# Patient Record
Sex: Female | Born: 1990 | Race: Black or African American | Hispanic: No | Marital: Single | Smoking: Never smoker
Health system: Southern US, Community
[De-identification: ages and names within clinical notes are randomized; demographics above are authoritative.]

## PROBLEM LIST (undated history)

## (undated) DIAGNOSIS — G43909 Migraine, unspecified, not intractable, without status migrainosus: Secondary | ICD-10-CM

## (undated) DIAGNOSIS — D649 Anemia, unspecified: Secondary | ICD-10-CM

## (undated) HISTORY — DX: Migraine, unspecified, not intractable, without status migrainosus: G43.909

## (undated) HISTORY — PX: OTHER SURGICAL HISTORY: SHX169

## (undated) HISTORY — PX: EYE SURGERY: SHX253

---

## 2010-01-27 ENCOUNTER — Emergency Department (HOSPITAL_COMMUNITY)
Admission: EM | Admit: 2010-01-27 | Discharge: 2010-01-27 | Payer: Self-pay | Source: Home / Self Care | Admitting: Family Medicine

## 2010-07-08 ENCOUNTER — Emergency Department (HOSPITAL_COMMUNITY)
Admission: EM | Admit: 2010-07-08 | Discharge: 2010-07-08 | Disposition: A | Discharge status: Home / Self Care | Payer: No Typology Code available for payment source | Attending: Emergency Medicine | Admitting: Emergency Medicine

## 2010-07-08 DIAGNOSIS — R109 Unspecified abdominal pain: Secondary | ICD-10-CM | POA: Insufficient documentation

## 2010-07-08 DIAGNOSIS — J45909 Unspecified asthma, uncomplicated: Secondary | ICD-10-CM | POA: Insufficient documentation

## 2010-07-08 DIAGNOSIS — R51 Headache: Secondary | ICD-10-CM | POA: Insufficient documentation

## 2010-07-08 DIAGNOSIS — R110 Nausea: Secondary | ICD-10-CM | POA: Insufficient documentation

## 2010-07-08 LAB — URINALYSIS, ROUTINE W REFLEX MICROSCOPIC
Glucose, UA: NEGATIVE mg/dL
Hgb urine dipstick: NEGATIVE
Specific Gravity, Urine: 1.014 (ref 1.005–1.030)

## 2010-07-08 LAB — PREGNANCY, URINE: Preg Test, Ur: NEGATIVE

## 2010-12-16 ENCOUNTER — Inpatient Hospital Stay (HOSPITAL_COMMUNITY)
Admission: RE | Admit: 2010-12-16 | Discharge: 2010-12-16 | Disposition: A | Discharge status: Home / Self Care | Payer: No Typology Code available for payment source | Source: Ambulatory Visit | Attending: Family Medicine | Admitting: Family Medicine

## 2010-12-21 ENCOUNTER — Inpatient Hospital Stay (HOSPITAL_COMMUNITY)
Admission: AD | Admit: 2010-12-21 | Discharge: 2010-12-21 | Disposition: A | Discharge status: Home / Self Care | Payer: No Typology Code available for payment source | Source: Ambulatory Visit | Attending: Obstetrics & Gynecology | Admitting: Obstetrics & Gynecology

## 2010-12-21 NOTE — Progress Notes (Signed)
Pt presented for std check.  Wanted full std panel drawn.  Explained to pt that we can do some std checks for her tonight but not a full check.  Pt states she would like the full work up and would rather call the health department in am.

## 2011-03-14 ENCOUNTER — Encounter (HOSPITAL_COMMUNITY): Payer: Self-pay | Admitting: *Deleted

## 2011-03-14 ENCOUNTER — Inpatient Hospital Stay (HOSPITAL_COMMUNITY)
Admission: AD | Admit: 2011-03-14 | Discharge: 2011-03-14 | Disposition: A | Discharge status: Home / Self Care | Payer: No Typology Code available for payment source | Source: Ambulatory Visit | Attending: Family Medicine | Admitting: Family Medicine

## 2011-03-14 DIAGNOSIS — B373 Candidiasis of vulva and vagina: Secondary | ICD-10-CM | POA: Insufficient documentation

## 2011-03-14 DIAGNOSIS — B3731 Acute candidiasis of vulva and vagina: Secondary | ICD-10-CM | POA: Insufficient documentation

## 2011-03-14 DIAGNOSIS — L293 Anogenital pruritus, unspecified: Secondary | ICD-10-CM | POA: Insufficient documentation

## 2011-03-14 HISTORY — DX: Anemia, unspecified: D64.9

## 2011-03-14 LAB — WET PREP, GENITAL
Trich, Wet Prep: NONE SEEN
Yeast Wet Prep HPF POC: NONE SEEN

## 2011-03-14 MED ORDER — FLUCONAZOLE 150 MG PO TABS
150.0000 mg | ORAL_TABLET | Freq: Once | ORAL | Status: AC
  Administered 2011-03-14: 150 mg via ORAL
  Filled 2011-03-14: qty 1

## 2011-03-14 NOTE — Progress Notes (Signed)
T. Burleson, NP at bedside.  Assessment done and poc discussed with pt.  

## 2011-03-14 NOTE — ED Provider Notes (Signed)
Chart reviewed and agree with management and plan.  

## 2011-03-14 NOTE — ED Provider Notes (Signed)
History     Chief Complaint  Patient presents with  . Vaginal Discharge   HPI Candace Vasquez 20 y.o. LMP 02-27-11.  Has vaginal discharge with itching and is concerned she is having either an allergic reaction to new soap, [place] spring or has trich again.  Had similar symptoms earlier this year and was diagnosed with trich.  She and her partners were treated.  Currently has female sex partner.     OB History    Grav Para Term Preterm Abortions TAB SAB Ect Mult Living   0               Past Medical History  Diagnosis Date  . Asthma   . Anemia     No past surgical history on file.  Family History  Problem Relation Age of Onset  . Cancer Mother   . Hypertension Father     History  Substance Use Topics  . Smoking status: Never Smoker   . Smokeless tobacco: Never Used  . Alcohol Use: No    Allergies: No Known Allergies  Prescriptions prior to admission  Medication Sig Dispense Refill  . IRON PO Take 1 tablet by mouth daily.          Review of Systems  Genitourinary:       Vaginal discharge with itching   Physical Exam   Blood pressure 123/71, pulse 95, temperature 98.7 F (37.1 C), temperature source Oral, resp. rate 20, height 5\' 8"  (1.727 m), weight 151 lb (68.493 kg), last menstrual period 02/27/2011.  Physical Exam  Nursing note and vitals reviewed. Constitutional: She is oriented to person, place, and time. She appears well-developed and well-nourished.  HENT:  Head: Normocephalic.  Eyes: EOM are normal.  Neck: Neck supple.  GI: Soft. There is no tenderness. There is no rebound and no guarding.  Genitourinary:       Speculum exam: Vulva:  negative Vagina - Small amount of clear mucous and  creamy discharge, no odor Cervix - No contact bleeding Bimanual exam: Cervix closed Uterus non tender, normal size Adnexa non tender, no masses bilaterally GC/Chlam, wet prep done Chaperone present for exam.  Musculoskeletal: Normal range of motion.    Neurological: She is alert and oriented to person, place, and time.  Skin: Skin is warm and dry.  Psychiatric: She has a normal mood and affect.    MAU Course  Procedures Results for orders placed during the hospital encounter of 03/14/11 (from the past 24 hour(s))  WET PREP, GENITAL     Status: Abnormal   Collection Time   03/14/11  2:50 AM      Component Value Range   Yeast, Wet Prep NONE SEEN  NONE SEEN    Trich, Wet Prep NONE SEEN  NONE SEEN    Clue Cells, Wet Prep NONE SEEN  NONE SEEN    WBC, Wet Prep HPF POC MODERATE (*) NONE SEEN        Assessment and Plan  Yeast infection  Plan Will give Diflucan in [place] GC/Chlam pending Advised to stop using [place] Spring soap and use Dove moisturizing.   BURLESON,TERRI 03/14/2011, 2:56 AM   Nolene Bernheim, NP 03/14/11 [phone]

## 2011-03-15 LAB — GC/CHLAMYDIA PROBE AMP, GENITAL: GC Probe Amp, Genital: NEGATIVE

## 2011-04-14 ENCOUNTER — Emergency Department (HOSPITAL_COMMUNITY)
Admission: EM | Admit: 2011-04-14 | Discharge: 2011-04-14 | Disposition: A | Discharge status: Home / Self Care | Payer: No Typology Code available for payment source | Attending: Emergency Medicine | Admitting: Emergency Medicine

## 2011-04-14 ENCOUNTER — Encounter (HOSPITAL_COMMUNITY): Payer: Self-pay | Admitting: Emergency Medicine

## 2011-04-14 DIAGNOSIS — R05 Cough: Secondary | ICD-10-CM | POA: Insufficient documentation

## 2011-04-14 DIAGNOSIS — J45909 Unspecified asthma, uncomplicated: Secondary | ICD-10-CM | POA: Insufficient documentation

## 2011-04-14 DIAGNOSIS — B9789 Other viral agents as the cause of diseases classified elsewhere: Secondary | ICD-10-CM | POA: Insufficient documentation

## 2011-04-14 DIAGNOSIS — H9209 Otalgia, unspecified ear: Secondary | ICD-10-CM | POA: Insufficient documentation

## 2011-04-14 DIAGNOSIS — H6090 Unspecified otitis externa, unspecified ear: Secondary | ICD-10-CM

## 2011-04-14 DIAGNOSIS — J029 Acute pharyngitis, unspecified: Secondary | ICD-10-CM | POA: Insufficient documentation

## 2011-04-14 DIAGNOSIS — R059 Cough, unspecified: Secondary | ICD-10-CM | POA: Insufficient documentation

## 2011-04-14 DIAGNOSIS — IMO0001 Reserved for inherently not codable concepts without codable children: Secondary | ICD-10-CM | POA: Insufficient documentation

## 2011-04-14 DIAGNOSIS — B349 Viral infection, unspecified: Secondary | ICD-10-CM

## 2011-04-14 DIAGNOSIS — H60399 Other infective otitis externa, unspecified ear: Secondary | ICD-10-CM | POA: Insufficient documentation

## 2011-04-14 DIAGNOSIS — J3489 Other specified disorders of nose and nasal sinuses: Secondary | ICD-10-CM | POA: Insufficient documentation

## 2011-04-14 DIAGNOSIS — R51 Headache: Secondary | ICD-10-CM | POA: Insufficient documentation

## 2011-04-14 MED ORDER — ACETAMINOPHEN-CODEINE 300-60 MG PO TABS: 1.0000 | ORAL_TABLET | ORAL | Status: AC | PRN

## 2011-04-14 MED ORDER — IBUPROFEN 800 MG PO TABS
800.0000 mg | ORAL_TABLET | Freq: Once | ORAL | Status: AC
  Administered 2011-04-14: 800 mg via ORAL
  Filled 2011-04-14: qty 1

## 2011-04-14 MED ORDER — CIPROFLOXACIN-DEXAMETHASONE 0.3-0.1 % OT SUSP
4.0000 [drp] | OTIC | Status: AC
  Administered 2011-04-14: 4 [drp] via OTIC
  Filled 2011-04-14: qty 7.5

## 2011-04-14 MED ORDER — ANTIPYRINE-BENZOCAINE 5.4-1.4 % OT SOLN
3.0000 [drp] | Freq: Once | OTIC | Status: AC
  Administered 2011-04-14: 3 [drp] via OTIC
  Filled 2011-04-14: qty 10

## 2011-04-14 MED ORDER — ONDANSETRON 4 MG PO TBDP
8.0000 mg | ORAL_TABLET | Freq: Once | ORAL | Status: AC
  Administered 2011-04-14: 8 mg via ORAL
  Filled 2011-04-14: qty 2

## 2011-04-14 MED ORDER — CIPROFLOXACIN-DEXAMETHASONE 0.3-0.1 % OT SUSP: 4.0000 [drp] | Freq: Two times a day (BID) | OTIC | Status: AC

## 2011-04-14 MED ORDER — IBUPROFEN 800 MG PO TABS: 800.0000 mg | ORAL_TABLET | Freq: Three times a day (TID) | ORAL | Status: AC

## 2011-04-14 NOTE — ED Notes (Signed)
Rx x 1, pt voiced understanding to f/u with clinic in 2 days.

## 2011-04-14 NOTE — ED Provider Notes (Signed)
History     CSN: [id number] Arrival date & time: 04/14/2011  3:17 AM   First MD Initiated Contact with Patient 04/14/11 0349      No chief complaint on file.   (Consider location/radiation/quality/duration/timing/severity/associated sxs/prior treatment) Patient is a 20 y.o. female presenting with ear pain. The history is provided by the patient.  Otalgia This is a new problem. The current episode started 12 to 24 hours ago. There is pain in the right ear. The problem occurs constantly. The problem has not changed since onset.Maximum temperature: Subjective fever and chills has not measured temperature. The fever has been present for less than 1 day. The pain is moderate. Associated symptoms include ear discharge, headaches, rhinorrhea, sore throat and cough. Pertinent negatives include no abdominal pain, no neck pain and no rash. Her past medical history does not include hearing loss.   patient presenting with flulike symptoms in addition to right ear pain. She has myalgias, sore throat, headache and congestion. Chills reports mild dry cough but no shortness of breath. Symptoms moderate in severity. No radiation of right ear pain. Quality is sharp in nature. No known sick contacts. Works in [employer] and did not get a flu shot this season.  Past Medical History  Diagnosis Date  . Asthma   . Anemia     History reviewed. No pertinent past surgical history.  Family History  Problem Relation Age of Onset  . Cancer Mother   . Hypertension Father     History  Substance Use Topics  . Smoking status: Never Smoker   . Smokeless tobacco: Never Used  . Alcohol Use: No    OB History    Grav Para Term Preterm Abortions TAB SAB Ect Mult Living   0               Review of Systems  Constitutional: Negative for fever and chills.  HENT: Positive for ear pain, sore throat, rhinorrhea and ear discharge. Negative for neck pain and neck stiffness.   Eyes: Negative for pain.  Respiratory:  Positive for cough. Negative for shortness of breath and wheezing.   Cardiovascular: Negative for chest pain, palpitations and leg swelling.  Gastrointestinal: Negative for abdominal pain.  Genitourinary: Negative for dysuria.  Musculoskeletal: Negative for back pain.  Skin: Negative for rash.  Neurological: Positive for headaches.  All other systems reviewed and are negative.    Allergies  Allegra and Zyrtec  Home Medications   Current Outpatient Rx  Name Route Sig Dispense Refill  . IRON PO Oral Take 1 tablet by mouth daily.        BP 108/63  Pulse 90  Temp 97.9 F (36.6 C)  Resp 16  SpO2 100%  LMP 04/02/2011  Physical Exam  Constitutional: She is oriented to person, place, and time. She appears well-developed and well-nourished.  HENT:  Head: Normocephalic and atraumatic.  Left Ear: External ear normal.  Mouth/Throat: No oropharyngeal exudate.       Right are regular tenderness with manipulation, and ear canal is clear other than some mild erythema. TM is clear. No mastoid tenderness or swelling.  Nasal congestion  Eyes: Conjunctivae and EOM are normal. Pupils are equal, round, and reactive to light.  Neck: Trachea normal. Neck supple. No thyromegaly present.       No nuchal rigidity  Cardiovascular: Normal rate, regular rhythm, S1 normal, S2 normal and normal pulses.     No systolic murmur is present   No diastolic murmur is present  Pulses:      Radial pulses are 2+ on the right side, and 2+ on the left side.  Pulmonary/Chest: Effort normal and breath sounds normal. She has no wheezes. She has no rhonchi. She has no rales. She exhibits no tenderness.  Abdominal: Soft. Normal appearance and bowel sounds are normal. There is no tenderness. There is no CVA tenderness and negative Murphy's sign.  Musculoskeletal: Normal range of motion.       BLE:s Calves nontender, no cords or erythema, negative Homans sign  Neurological: She is alert and oriented to person,  place, and time. She has normal strength. No cranial nerve deficit or sensory deficit. GCS eye subscore is 4. GCS verbal subscore is 5. GCS motor subscore is 6.  Skin: Skin is warm and dry. No rash noted. She is not diaphoretic.  Psychiatric: Her speech is normal.       Cooperative and appropriate    ED Course  Procedures (including critical care time)  Motrin, auralgan, Ciprodex   MDM   Clinical otitis externa also with flulike symptoms. Treated for same. Prescription for medications as above in addition to tells codeine as needed for cough. Work note provided. Influenza precautions verbalized as understood.        Sunnie Nielsen, MD 04/14/11 [phone]
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
## 2011-04-29 ENCOUNTER — Emergency Department (INDEPENDENT_AMBULATORY_CARE_PROVIDER_SITE_OTHER)
Admission: EM | Admit: 2011-04-29 | Discharge: 2011-04-29 | Disposition: A | Discharge status: Home / Self Care | Source: Home / Self Care | Attending: Emergency Medicine | Admitting: Emergency Medicine

## 2011-04-29 ENCOUNTER — Encounter (HOSPITAL_COMMUNITY): Payer: Self-pay

## 2011-04-29 DIAGNOSIS — S335XXA Sprain of ligaments of lumbar spine, initial encounter: Secondary | ICD-10-CM

## 2011-04-29 DIAGNOSIS — S39012A Strain of muscle, fascia and tendon of lower back, initial encounter: Secondary | ICD-10-CM

## 2011-04-29 LAB — POCT URINALYSIS DIP (DEVICE)
Nitrite: NEGATIVE
Urobilinogen, UA: 2 mg/dL — ABNORMAL HIGH (ref 0.0–1.0)
pH: 8.5 — ABNORMAL HIGH (ref 5.0–8.0)

## 2011-04-29 MED ORDER — METHOCARBAMOL 500 MG PO TABS: 500.0000 mg | ORAL_TABLET | Freq: Four times a day (QID) | ORAL | Status: AC

## 2011-04-29 MED ORDER — IBUPROFEN 600 MG PO TABS: 600.0000 mg | ORAL_TABLET | Freq: Four times a day (QID) | ORAL | Status: AC | PRN

## 2011-04-29 MED ORDER — HYDROCODONE-ACETAMINOPHEN 5-325 MG PO TABS: 2.0000 | ORAL_TABLET | ORAL | Status: AC | PRN

## 2011-04-29 NOTE — ED Notes (Signed)
All questions answered and discharge instructions by Dr. Chaney Malling, rx foe hydrocodone-acetaminophen 5-325, ibuprofen 600 mg, methocarbamol 500 mg given to pt

## 2011-04-29 NOTE — ED Provider Notes (Signed)
History     CSN: [id number]  Arrival date & time 04/29/11  1313   First MD Initiated Contact with Patient 04/29/11 1344      Chief Complaint  Patient presents with  . 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 Does a lot of torso rotation and heavy lifting.  Now with throbbing alternating with sharp intermittent nonradiating lower back pain worse with torso rotation, bending forward. Pain started last night. Pain lasts several minutes and resolves. Pain better with rest. Tried tylenol 1 gm w/o relief last night. No h/o injury to the back. No fevers, abd pain, urinary c/o,  vaginal c/o.   Patient is a 20 y.o. female presenting with back pain. The history is provided by the patient.  Back Pain  This is a new problem. The current episode started yesterday. The problem occurs constantly. The pain is associated with lifting heavy objects and twisting. The pain is present in the lumbar spine. The quality of the pain is described as aching and stabbing. The pain does not radiate. The symptoms are aggravated by twisting, certain positions and bending. Pertinent negatives include no chest pain, no fever, no numbness, no weight loss, no headaches, no abdominal pain, no abdominal swelling, no bowel incontinence, no bladder incontinence, no dysuria, no pelvic pain, no leg pain, no paresthesias, no paresis, no tingling and no weakness. Treatments tried: tylenol 1 gm. The treatment provided no relief.    Past Medical History  Diagnosis Date  . Asthma   . Anemia     History reviewed. No pertinent past surgical history.  Family History  Problem Relation Age of Onset  . Cancer Mother   . Hypertension Father     History  Substance Use Topics  . Smoking status: Never Smoker   . Smokeless tobacco: Never Used  . Alcohol Use: No    OB History    Grav Para Term Preterm Abortions TAB SAB Ect Mult Living   0                Review of Systems  Constitutional: Negative for fever and weight loss.  Cardiovascular: Negative for chest pain.  Gastrointestinal: Negative for abdominal pain and bowel incontinence.  Genitourinary: Negative for bladder incontinence, dysuria and pelvic pain.  Musculoskeletal: Positive for back pain.  Neurological: Negative for tingling, weakness, numbness, headaches and paresthesias.    Allergies  Allegra and Zyrtec  Home Medications   Current Outpatient Rx  Name Route Sig Dispense Refill  . HYDROCODONE-ACETAMINOPHEN 5-325 MG PO TABS Oral Take 2 tablets by mouth every 4 (four) hours as needed for pain. 20 tablet 0  . IBUPROFEN 600 MG PO TABS Oral Take 1 tablet (600 mg total) by mouth every 6 (six) hours as needed for pain. 30 tablet 0  . IRON PO Oral Take 1 tablet by mouth daily.      Marland Kitchen METHOCARBAMOL 500 MG PO TABS Oral Take 1 tablet (500 mg total) by mouth 4 (four) times daily. 40 tablet 0    BP 110/60  Pulse 75  Temp(Src) 98.4 F (36.9 C) (Oral)  Resp 15  SpO2 100%  LMP 04/20/2011  Physical Exam  Constitutional: She is oriented to person, place, and time. She appears well-developed and well-nourished. No distress.  HENT:  Head: Atraumatic.  Eyes: Conjunctivae and EOM are normal. Pupils are equal, round, and reactive to light.  Neck: Normal range  of motion.  Cardiovascular: Regular rhythm and intact distal pulses.   Pulmonary/Chest: Effort normal. No respiratory distress. She has no wheezes. She has no rales. She exhibits no tenderness.  Abdominal: Soft. There is no tenderness. There is no rebound, no guarding and no CVA tenderness.  Musculoskeletal: She exhibits no edema and no tenderness.       Lumbar back: She exhibits tenderness and spasm. She exhibits no bony tenderness, no swelling and no edema.       paralumbar Tenderness L5/S1. iBilateral lower extremities nontender, baseline ROM with intact DP  PT pulses, Sensation baseline light touch bilaterally for Pt,  DTR's symmetric and intact bilaterally KJ, Motor symmetric bilateral 5/5 hip flexion, quadriceps, hamstrings, EHL, foot dorsiflexion, foot plantarflexion, gait normal. No pain with PROM hips bilaterally. SLR neg bilaterally  Neurological: She is alert and oriented to person, place, and time.  Skin: Skin is warm. No rash noted.  Psychiatric: She has a normal mood and affect. Her behavior is normal. Judgment and thought content normal.    ED Course  Procedures (including critical care time)  Labs Reviewed  POCT URINALYSIS DIP (DEVICE) - Abnormal; Notable for the following:    Hgb urine dipstick TRACE (*)    pH 8.5 (*)    Urobilinogen, UA 2.0 (*)    All other components within normal limits  POCT PREGNANCY, URINE  POCT URINALYSIS DIPSTICK  POCT PREGNANCY, URINE   No results found.   1. Lumbar strain       MDM  No evidence of uti, nephrolithiasis. No evidence of spinal cord involvement based on H&P. Pt has changed physical activity earlier this week and reports "a lot" of lifting and torso rotation.  Pain present for <6 week duration. No red flags such as fevers, age >65, h/o trauma with bony tenderness, neurological deficits, bladder/ bowel incontinence, h/o CA, unexplained weight loss, pain worse at night,  h/o prolonged steroid use, h/o osteopenia, h/o IVDU. Imaging not indicated at this time. Will have f/u with [facility] as this happened on the job.    Luiz Blare, MD 04/29/11 [phone]

## 2011-04-29 NOTE — ED Notes (Signed)
Pt has low back pain that started last pm, no known injury and denies urinary s/s.

## 2011-06-01 ENCOUNTER — Emergency Department (INDEPENDENT_AMBULATORY_CARE_PROVIDER_SITE_OTHER)
Admission: EM | Admit: 2011-06-01 | Discharge: 2011-06-01 | Disposition: A | Discharge status: Home / Self Care | Payer: Self-pay | Source: Home / Self Care

## 2011-06-01 ENCOUNTER — Encounter (HOSPITAL_COMMUNITY): Payer: Self-pay | Admitting: Emergency Medicine

## 2011-06-01 DIAGNOSIS — M62838 Other muscle spasm: Secondary | ICD-10-CM

## 2011-06-01 DIAGNOSIS — R51 Headache: Secondary | ICD-10-CM

## 2011-06-01 MED ORDER — IBUPROFEN 600 MG PO TABS: 600.0000 mg | ORAL_TABLET | Freq: Three times a day (TID) | ORAL | Status: DC | PRN

## 2011-06-01 MED ORDER — CYCLOBENZAPRINE HCL 10 MG PO TABS: 10.0000 mg | ORAL_TABLET | Freq: Two times a day (BID) | ORAL | Status: DC | PRN
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
## 2011-06-02 ENCOUNTER — Encounter (HOSPITAL_COMMUNITY): Payer: Self-pay | Admitting: Emergency Medicine

## 2011-06-02 ENCOUNTER — Emergency Department (HOSPITAL_COMMUNITY)
Admission: EM | Admit: 2011-06-02 | Discharge: 2011-06-03 | Disposition: A | Discharge status: Home / Self Care | Payer: No Typology Code available for payment source | Attending: Emergency Medicine | Admitting: Emergency Medicine

## 2011-06-02 ENCOUNTER — Emergency Department (HOSPITAL_COMMUNITY): Payer: No Typology Code available for payment source

## 2011-06-02 DIAGNOSIS — J45909 Unspecified asthma, uncomplicated: Secondary | ICD-10-CM | POA: Insufficient documentation

## 2011-06-02 DIAGNOSIS — R51 Headache: Secondary | ICD-10-CM | POA: Insufficient documentation

## 2011-06-02 DIAGNOSIS — R42 Dizziness and giddiness: Secondary | ICD-10-CM | POA: Insufficient documentation

## 2011-06-02 DIAGNOSIS — M545 Low back pain, unspecified: Secondary | ICD-10-CM | POA: Insufficient documentation

## 2011-06-02 DIAGNOSIS — S060X9A Concussion with loss of consciousness of unspecified duration, initial encounter: Secondary | ICD-10-CM | POA: Insufficient documentation

## 2011-06-02 DIAGNOSIS — H5500 Unspecified nystagmus: Secondary | ICD-10-CM | POA: Insufficient documentation

## 2011-06-02 DIAGNOSIS — H538 Other visual disturbances: Secondary | ICD-10-CM | POA: Insufficient documentation

## 2011-06-02 DIAGNOSIS — S060XAA Concussion with loss of consciousness status unknown, initial encounter: Secondary | ICD-10-CM | POA: Insufficient documentation

## 2011-06-02 MED ORDER — OXYCODONE-ACETAMINOPHEN 5-325 MG PO TABS
1.0000 | ORAL_TABLET | Freq: Once | ORAL | Status: AC
  Administered 2011-06-02: 1 via ORAL
  Filled 2011-06-02: qty 1
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
## 2011-06-03 MED ORDER — HYDROCODONE-ACETAMINOPHEN 5-325 MG PO TABS: ORAL_TABLET | ORAL | Status: AC

## 2011-06-03 NOTE — ED Provider Notes (Signed)
Medical screening examination/treatment/procedure(s) were performed by non-physician practitioner and as supervising physician I was immediately available for consultation/collaboration.  Flint Melter, MD 06/03/11 [phone]

## 2011-12-15 ENCOUNTER — Encounter (HOSPITAL_COMMUNITY): Payer: Self-pay

## 2011-12-15 ENCOUNTER — Emergency Department (INDEPENDENT_AMBULATORY_CARE_PROVIDER_SITE_OTHER)
Admission: EM | Admit: 2011-12-15 | Discharge: 2011-12-15 | Disposition: A | Discharge status: Home / Self Care | Payer: No Typology Code available for payment source | Source: Home / Self Care

## 2011-12-15 DIAGNOSIS — S058X9A Other injuries of unspecified eye and orbit, initial encounter: Secondary | ICD-10-CM

## 2011-12-15 DIAGNOSIS — H5711 Ocular pain, right eye: Secondary | ICD-10-CM

## 2011-12-15 DIAGNOSIS — H5710 Ocular pain, unspecified eye: Secondary | ICD-10-CM

## 2011-12-15 DIAGNOSIS — S0501XA Injury of conjunctiva and corneal abrasion without foreign body, right eye, initial encounter: Secondary | ICD-10-CM

## 2011-12-15 MED ORDER — TETRACAINE HCL 0.5 % OP SOLN
OPHTHALMIC | Status: AC
  Filled 2011-12-15: qty 2

## 2011-12-15 MED ORDER — MOXIFLOXACIN HCL 0.5 % OP SOLN: 1.0000 [drp] | Freq: Three times a day (TID) | OPHTHALMIC | Status: AC

## 2011-12-15 NOTE — ED Provider Notes (Signed)
History     CSN: [id number]  Arrival date & time 12/15/11  1136   None     Chief Complaint  Patient presents with  . Eye Pain    (Consider location/radiation/quality/duration/timing/severity/associated sxs/prior treatment) Patient is a 21 y.o. female presenting with eye pain. The history is provided by the patient.  Eye Pain  Patient reports right eye pain and photophobia for 6 days.  No known injury or foreign body.  States has used eye wash at home with no change in condition.  Denies previous history of same.  Not diabetic, no previous eye surgery/trauma, denies cataracts or glaucoma. No floaters or flashing lights No vision loss + blurred vision + eye pain+ No eyelid itching No tearing No headache/scalp tenderness Does wear contacts and glasses.  Last wore contacts 2 months ago.  Has history of dry eyes.    Past Medical History  Diagnosis Date  . Asthma   . Anemia     History reviewed. No pertinent past surgical history.  Family History  Problem Relation Age of Onset  . Cancer Mother   . Hypertension Father     History  Substance Use Topics  . Smoking status: Never Smoker   . Smokeless tobacco: Never Used  . Alcohol Use: Yes    OB History    Grav Para Term Preterm Abortions TAB SAB Ect Mult Living   0               Review of Systems  Constitutional: Negative.   HENT: Negative.   Eyes: Positive for photophobia, pain and discharge. Negative for redness, itching and visual disturbance.  Respiratory: Negative.   Cardiovascular: Negative.   Neurological: Negative.     Allergies  Fexofenadine hcl and Zyrtec  Home Medications   Current Outpatient Rx  Name Route Sig Dispense Refill  . IRON PO Oral Take 1 tablet by mouth daily.      Marland Kitchen MOXIFLOXACIN HCL 0.5 % OP SOLN Right Eye Place 1 drop into the right eye 3 (three) times daily. 3 mL 0    BP 121/78  Pulse 79  Temp 98.6 F (37 C) (Oral)  Resp 18  SpO2 100%  Physical Exam  Nursing note and  vitals reviewed. Constitutional: She is oriented to person, place, and time. Vital signs are normal. She appears well-developed and well-nourished. She is active and cooperative.  HENT:  Head: Normocephalic.  Right Ear: External ear normal.  Left Ear: External ear normal.  Nose: Nose normal.  Mouth/Throat: Oropharynx is clear and moist. No oropharyngeal exudate.  Eyes: Conjunctivae, EOM and lids are normal. Pupils are equal, round, and reactive to light. Right eye exhibits no discharge and no exudate. No foreign body present in the right eye. Left eye exhibits no discharge and no exudate. No foreign body present in the left eye. No scleral icterus.         Tetracaine and fluoroscien applied to right eye, examined with woods lamp.  right corneal abrasion noted, no foreign body found.  Pt tolerated procedure well  Neck: Trachea normal. Neck supple.  Cardiovascular: Normal rate, regular rhythm, normal heart sounds and normal pulses.   Pulmonary/Chest: Effort normal and breath sounds normal.  Lymphadenopathy:       Head (right side): No submental, no submandibular, no tonsillar, no preauricular, no posterior auricular and no occipital adenopathy present.       Head (left side): No submental, no submandibular, no tonsillar, no preauricular, no posterior auricular and no occipital  adenopathy present.    She has no cervical adenopathy.  Neurological: She is alert and oriented to person, place, and time. No cranial nerve deficit or sensory deficit. GCS eye subscore is 4. GCS verbal subscore is 5. GCS motor subscore is 6.  Skin: Skin is warm and dry.  Psychiatric: She has a normal mood and affect. Her speech is normal and behavior is normal. Judgment and thought content normal. Cognition and memory are normal.    ED Course  Procedures (including critical care time)  Labs Reviewed - No data to display No results found.   1. Right corneal abrasion   2. Pain in right eye       MDM    Antibiotic eye drops.  Apply warm compresses four times daily. Discontinue the use of eye makeup as well as eye lotions and creams because they may be infected, obtain new. Discard your current contact lenses (if applicable) to decrease re-infection.  Seek follow-up care with an ophthalmologist within 1-2 weeks if the condition is not resolved completely with prescribed management.        Johnsie Kindred, NP 12/15/11 1334

## 2011-12-15 NOTE — ED Notes (Signed)
Pt has rt sided eye pain since Monday, no known injury.

## 2011-12-19 NOTE — ED Provider Notes (Signed)
Medical screening examination/treatment/procedure(s) were performed by resident physician or non-physician practitioner and as supervising physician I was immediately available for consultation/collaboration.   Barkley Bruns MD.    Linna Hoff, MD 12/19/11 2007

## 2012-08-04 ENCOUNTER — Encounter (HOSPITAL_COMMUNITY): Payer: Self-pay

## 2012-08-04 ENCOUNTER — Emergency Department (HOSPITAL_COMMUNITY)
Admission: EM | Admit: 2012-08-04 | Discharge: 2012-08-05 | Disposition: A | Discharge status: Home / Self Care | Payer: No Typology Code available for payment source | Attending: Emergency Medicine | Admitting: Emergency Medicine

## 2012-08-04 DIAGNOSIS — Z862 Personal history of diseases of the blood and blood-forming organs and certain disorders involving the immune mechanism: Secondary | ICD-10-CM | POA: Insufficient documentation

## 2012-08-04 DIAGNOSIS — J45909 Unspecified asthma, uncomplicated: Secondary | ICD-10-CM | POA: Insufficient documentation

## 2012-08-04 DIAGNOSIS — Z3202 Encounter for pregnancy test, result negative: Secondary | ICD-10-CM | POA: Insufficient documentation

## 2012-08-04 DIAGNOSIS — R1013 Epigastric pain: Secondary | ICD-10-CM | POA: Insufficient documentation

## 2012-08-04 DIAGNOSIS — R1011 Right upper quadrant pain: Secondary | ICD-10-CM | POA: Insufficient documentation

## 2012-08-04 LAB — URINALYSIS, MICROSCOPIC ONLY
Bilirubin Urine: NEGATIVE
Glucose, UA: NEGATIVE mg/dL
Hgb urine dipstick: NEGATIVE
Specific Gravity, Urine: 1.02 (ref 1.005–1.030)
Urobilinogen, UA: 1 mg/dL (ref 0.0–1.0)

## 2012-08-04 LAB — CBC WITH DIFFERENTIAL/PLATELET
Basophils Absolute: 0 10*3/uL (ref 0.0–0.1)
Basophils Relative: 0 % (ref 0–1)
Eosinophils Relative: 2 % (ref 0–5)
HCT: 37 % (ref 36.0–46.0)
Hemoglobin: 12.5 g/dL (ref 12.0–15.0)
Lymphs Abs: 3.5 10*3/uL (ref 0.7–4.0)
MCH: 24.9 pg — ABNORMAL LOW (ref 26.0–34.0)
MCHC: 33.8 g/dL (ref 30.0–36.0)
MCV: 73.7 fL — ABNORMAL LOW (ref 78.0–100.0)
Neutro Abs: 6.8 10*3/uL (ref 1.7–7.7)
Neutrophils Relative %: 60 % (ref 43–77)
RBC: 5.02 MIL/uL (ref 3.87–5.11)

## 2012-08-04 LAB — COMPREHENSIVE METABOLIC PANEL
ALT: 12 U/L (ref 0–35)
AST: 26 U/L (ref 0–37)
Albumin: 4.3 g/dL (ref 3.5–5.2)
CO2: 24 mEq/L (ref 19–32)
Calcium: 9.6 mg/dL (ref 8.4–10.5)
Creatinine, Ser: 0.7 mg/dL (ref 0.50–1.10)
Sodium: 140 mEq/L (ref 135–145)
Total Protein: 8.4 g/dL — ABNORMAL HIGH (ref 6.0–8.3)

## 2012-08-04 LAB — POCT I-STAT TROPONIN I

## 2012-08-04 LAB — LIPASE, BLOOD: Lipase: 47 U/L (ref 11–59)

## 2012-08-04 NOTE — ED Notes (Signed)
Patient presents with c/o epigastric pain. Onset this AM upon wakening. Describes as a burning sensation. Reports one episode of vomiting and one episode of diarrhea. Patient able to eat and drink without difficulty. Denies fevers. Endorses sweats and chills. No hx GERD or gastric ulcers. No tobacco use. Occasional ETOH.

## 2012-08-05 ENCOUNTER — Emergency Department (HOSPITAL_COMMUNITY): Payer: No Typology Code available for payment source

## 2012-08-05 MED ORDER — FAMOTIDINE 20 MG PO TABS
20.0000 mg | ORAL_TABLET | Freq: Once | ORAL | Status: AC
  Administered 2012-08-05: 20 mg via ORAL
  Filled 2012-08-05: qty 1

## 2012-08-05 MED ORDER — GI COCKTAIL ~~LOC~~
30.0000 mL | Freq: Once | ORAL | Status: AC
  Administered 2012-08-05: 30 mL via ORAL
  Filled 2012-08-05: qty 30

## 2012-08-05 MED ORDER — PANTOPRAZOLE SODIUM 40 MG PO TBEC
40.0000 mg | DELAYED_RELEASE_TABLET | Freq: Once | ORAL | Status: AC
  Administered 2012-08-05: 40 mg via ORAL
  Filled 2012-08-05: qty 1

## 2012-08-05 MED ORDER — FAMOTIDINE 20 MG PO TABS: 20.0000 mg | ORAL_TABLET | Freq: Two times a day (BID) | ORAL | Status: DC

## 2012-08-05 NOTE — ED Provider Notes (Signed)
History     CSN: [id number]  Arrival date & time 08/04/12  6295   First MD Initiated Contact with Patient 08/05/12 0119      Chief Complaint  Patient presents with  . Abdominal Pain    (Consider location/radiation/quality/duration/timing/severity/associated sxs/prior treatment) HPI Hx per PT, RUQ and epigastric pain x 2 weeks, on and off, has abnormal taste in her mouth but denies any heartburn or reflux. Pain not related to eating, pain is mod in severity, burning pain. Milk helps sometimes. No F/C, no trauma Past Medical History  Diagnosis Date  . Asthma   . Anemia   . Hypoglycemia     History reviewed. No pertinent past surgical history.  Family History  Problem Relation Age of Onset  . Cancer Mother   . Hypertension Father     History  Substance Use Topics  . Smoking status: Never Smoker   . Smokeless tobacco: Never Used  . Alcohol Use: Yes     Comment: occasional     OB History   Grav Para Term Preterm Abortions TAB SAB Ect Mult Living   0               Review of Systems  Constitutional: Negative for fever and chills.  HENT: Negative for neck pain and neck stiffness.   Eyes: Negative for pain.  Respiratory: Negative for shortness of breath.   Cardiovascular: Negative for chest pain.  Gastrointestinal: Positive for abdominal pain. Negative for vomiting.  Genitourinary: Negative for dysuria.  Musculoskeletal: Negative for back pain.  Skin: Negative for rash.  Neurological: Negative for headaches.  All other systems reviewed and are negative.    Allergies  Fexofenadine hcl and Zyrtec  Home Medications   Current Outpatient Rx  Name  Route  Sig  Dispense  Refill  . IRON PO   Oral   Take 1 tablet by mouth daily.             BP 114/65  Pulse 93  Temp(Src) 98.3 F (36.8 C) (Oral)  Resp 16  SpO2 99%  LMP 07/21/2012  Physical Exam  Constitutional: She is oriented to person, place, and time. She appears well-developed and well-nourished.   HENT:  Head: Normocephalic and atraumatic.  Eyes: EOM are normal. Pupils are equal, round, and reactive to light.  Neck: Neck supple.  Cardiovascular: Normal rate, regular rhythm and intact distal pulses.   Pulmonary/Chest: Effort normal and breath sounds normal. No respiratory distress. She exhibits no tenderness.  Abdominal: Soft. Bowel sounds are normal. She exhibits no distension. There is no rebound and no guarding.  TTP epigastric somewhat RUQ neg Murphys sign  Musculoskeletal: Normal range of motion. She exhibits no edema.  Neurological: She is alert and oriented to person, place, and time.  Skin: Skin is warm and dry.    ED Course  Procedures (including critical care time)  Results for orders placed during the hospital encounter of 08/04/12  URINALYSIS, MICROSCOPIC ONLY      Result Value Range   Color, Urine YELLOW  YELLOW   APPearance CLOUDY (*) CLEAR   Specific Gravity, Urine 1.020  1.005 - 1.030   pH 6.0  5.0 - 8.0   Glucose, UA NEGATIVE  NEGATIVE mg/dL   Hgb urine dipstick NEGATIVE  NEGATIVE   Bilirubin Urine NEGATIVE  NEGATIVE   Ketones, ur NEGATIVE  NEGATIVE mg/dL   Protein, ur NEGATIVE  NEGATIVE mg/dL   Urobilinogen, UA 1.0  0.0 - 1.0 mg/dL   Nitrite NEGATIVE  NEGATIVE   Leukocytes, UA TRACE (*) NEGATIVE   WBC, UA 3-6  <3 WBC/hpf   RBC / HPF 0-2  <3 RBC/hpf   Bacteria, UA MANY (*) RARE   Squamous Epithelial / LPF FEW (*) RARE  LIPASE, BLOOD      Result Value Range   Lipase 47  11 - 59 U/L  COMPREHENSIVE METABOLIC PANEL      Result Value Range   Sodium 140  135 - 145 mEq/L   Potassium 4.1  3.5 - 5.1 mEq/L   Chloride 104  96 - 112 mEq/L   CO2 24  19 - 32 mEq/L   Glucose, Bld 86  70 - 99 mg/dL   BUN 13  6 - 23 mg/dL   Creatinine, Ser 0.98  0.50 - 1.10 mg/dL   Calcium 9.6  8.4 - 11.9 mg/dL   Total Protein 8.4 (*) 6.0 - 8.3 g/dL   Albumin 4.3  3.5 - 5.2 g/dL   AST 26  0 - 37 U/L   ALT 12  0 - 35 U/L   Alkaline Phosphatase 72  39 - 117 U/L   Total  Bilirubin 0.3  0.3 - 1.2 mg/dL   GFR calc non Af Amer >90  >90 mL/min   GFR calc Af Amer >90  >90 mL/min  CBC WITH DIFFERENTIAL      Result Value Range   WBC 11.3 (*) 4.0 - 10.5 K/uL   RBC 5.02  3.87 - 5.11 MIL/uL   Hemoglobin 12.5  12.0 - 15.0 g/dL   HCT 14.7  82.9 - 56.2 %   MCV 73.7 (*) 78.0 - 100.0 fL   MCH 24.9 (*) 26.0 - 34.0 pg   MCHC 33.8  30.0 - 36.0 g/dL   RDW 13.0 (*) 86.5 - 78.4 %   Platelets 310  150 - 400 K/uL   Neutrophils Relative 60  43 - 77 %   Lymphocytes Relative 31  12 - 46 %   Monocytes Relative 7  3 - 12 %   Eosinophils Relative 2  0 - 5 %   Basophils Relative 0  0 - 1 %   RBC Morphology ELLIPTOCYTES     WBC Morphology SMUDGE CELLS     Neutro Abs 6.8  1.7 - 7.7 K/uL   Lymphs Abs 3.5  0.7 - 4.0 K/uL   Monocytes Absolute 0.8  0.1 - 1.0 K/uL   Eosinophils Absolute 0.2  0.0 - 0.7 K/uL   Basophils Absolute 0.0  0.0 - 0.1 K/uL  POCT PREGNANCY, URINE      Result Value Range   Preg Test, Ur NEGATIVE  NEGATIVE  POCT I-STAT TROPONIN I      Result Value Range   Troponin i, poc 0.01  0.00 - 0.08 ng/mL   Comment 3            US Abdomen Complete  08/05/2012  *RADIOLOGY REPORT*  Clinical Data:  Right upper quadrant abdominal pain and leukocytosis.  ABDOMINAL ULTRASOUND COMPLETE  Comparison:  None  Findings:  Gallbladder:  The gallbladder is normal in appearance, without evidence for gallstones, gallbladder wall thickening or pericholecystic fluid.  No ultrasonographic Murphy's sign is elicited.  Common Bile Duct:  0.2 cm in diameter; within normal limits in caliber.  Not well characterized distally due to overlying bowel gas.  Liver:  Normal parenchymal echogenicity and echotexture; no focal lesions identified.  Limited Doppler evaluation demonstrates normal blood flow within the liver.  IVC:  Unremarkable in  appearance.  Pancreas:  Although the pancreas is difficult to visualize in its entirety due to overlying bowel gas, no focal pancreatic abnormality is identified.   Spleen:  8.1 cm in length; within normal limits in size and echotexture.  Right kidney:  8.9 cm in length; normal in size, configuration and parenchymal echogenicity.  No evidence of mass or hydronephrosis. The lower pole is not well characterized due to overlying bowel gas.  Left kidney:  10.4 cm in length; normal in size, configuration and parenchymal echogenicity.  No evidence of mass or hydronephrosis.  Abdominal Aorta:  Normal in caliber; no aneurysm identified.  IMPRESSION: Unremarkable abdominal ultrasound.   Original Report Authenticated By: Tonia Ghent, M.D.    Pepcid, protonix  Improved with GI cocktail and medications above. Ultrasound labs reviewed and plan discharge with Pepcid and GERD precautions.  MDM   Epigastric right upper quadrant pain with workup as above: Ultrasound labs reviewed. Improved with GI medications. Vital signs and nursing notes reviewed       Sunnie Nielsen, MD 08/05/12 2259

## 2012-08-05 NOTE — ED Notes (Signed)
Pt. Reports mid epigastric abdominal pain x2 weeks. States it she got it check out in [place] and told it was gas. Pt. States vomiting and diarrhea this AM. States pain is burning and throbbing.

## 2012-08-05 NOTE — ED Notes (Signed)
Pt. Returned from ultrasound

## 2012-08-06 LAB — URINE CULTURE: Colony Count: NO GROWTH

## 2013-12-09 ENCOUNTER — Emergency Department (HOSPITAL_COMMUNITY)
Admission: EM | Admit: 2013-12-09 | Discharge: 2013-12-09 | Disposition: A | Discharge status: Home / Self Care | Payer: No Typology Code available for payment source | Attending: Emergency Medicine | Admitting: Emergency Medicine

## 2013-12-09 ENCOUNTER — Encounter (HOSPITAL_COMMUNITY): Payer: Self-pay | Admitting: Emergency Medicine

## 2013-12-09 DIAGNOSIS — R112 Nausea with vomiting, unspecified: Secondary | ICD-10-CM | POA: Insufficient documentation

## 2013-12-09 DIAGNOSIS — Z79899 Other long term (current) drug therapy: Secondary | ICD-10-CM | POA: Insufficient documentation

## 2013-12-09 DIAGNOSIS — J45909 Unspecified asthma, uncomplicated: Secondary | ICD-10-CM | POA: Diagnosis not present

## 2013-12-09 DIAGNOSIS — D649 Anemia, unspecified: Secondary | ICD-10-CM | POA: Insufficient documentation

## 2013-12-09 DIAGNOSIS — Z862 Personal history of diseases of the blood and blood-forming organs and certain disorders involving the immune mechanism: Secondary | ICD-10-CM | POA: Insufficient documentation

## 2013-12-09 DIAGNOSIS — Z8639 Personal history of other endocrine, nutritional and metabolic disease: Secondary | ICD-10-CM | POA: Insufficient documentation

## 2013-12-09 DIAGNOSIS — H53149 Visual discomfort, unspecified: Secondary | ICD-10-CM | POA: Diagnosis not present

## 2013-12-09 DIAGNOSIS — R109 Unspecified abdominal pain: Secondary | ICD-10-CM | POA: Insufficient documentation

## 2013-12-09 DIAGNOSIS — R519 Headache, unspecified: Secondary | ICD-10-CM

## 2013-12-09 DIAGNOSIS — R51 Headache: Secondary | ICD-10-CM | POA: Insufficient documentation

## 2013-12-09 MED ORDER — SODIUM CHLORIDE 0.9 % IV SOLN: 1000.0000 mL | INTRAVENOUS | Status: DC

## 2013-12-09 MED ORDER — METOCLOPRAMIDE HCL 5 MG/ML IJ SOLN
10.0000 mg | Freq: Once | INTRAMUSCULAR | Status: AC
  Administered 2013-12-09: 10 mg via INTRAVENOUS
  Filled 2013-12-09: qty 2

## 2013-12-09 MED ORDER — METOCLOPRAMIDE HCL 10 MG PO TABS: 10.0000 mg | ORAL_TABLET | Freq: Four times a day (QID) | ORAL | Status: DC | PRN

## 2013-12-09 MED ORDER — SODIUM CHLORIDE 0.9 % IV SOLN
1000.0000 mL | Freq: Once | INTRAVENOUS | Status: AC
  Administered 2013-12-09: 1000 mL via INTRAVENOUS

## 2013-12-09 MED ORDER — DIPHENHYDRAMINE HCL 50 MG/ML IJ SOLN
25.0000 mg | Freq: Once | INTRAMUSCULAR | Status: AC
  Administered 2013-12-09: 25 mg via INTRAVENOUS
  Filled 2013-12-09: qty 1

## 2013-12-09 NOTE — ED Notes (Signed)
Pt arrived during downtime - please see downtime documentation for charting prior to 0425am.

## 2013-12-09 NOTE — ED Notes (Signed)
MD Glick at bedside. 

## 2013-12-09 NOTE — Discharge Instructions (Signed)
General Headache Without Cause °A headache is pain or discomfort felt around the head or neck area. The specific cause of a headache may not be found. There are many causes and types of headaches. A few common ones are: °· Tension headaches. °· Migraine headaches. °· Cluster headaches. °· Chronic daily headaches. °HOME CARE INSTRUCTIONS  °· Keep all follow-up appointments with your caregiver or any specialist referral. °· Only take over-the-counter or prescription medicines for pain or discomfort as directed by your caregiver. °· Lie down in a dark, quiet room when you have a headache. °· Keep a headache journal to find out what may trigger your migraine headaches. For example, write down: °¨ What you eat and drink. °¨ How much sleep you get. °¨ Any change to your diet or medicines. °· Try massage or other relaxation techniques. °· Put ice packs or heat on the head and neck. Use these 3 to 4 times per day for 15 to 20 minutes each time, or as needed. °· Limit stress. °· Sit up straight, and do not tense your muscles. °· Quit smoking if you smoke. °· Limit alcohol use. °· Decrease the amount of caffeine you drink, or stop drinking caffeine. °· Eat and sleep on a regular schedule. °· Get 7 to 9 hours of sleep, or as recommended by your caregiver. °· Keep lights dim if bright lights bother you and make your headaches worse. °SEEK MEDICAL CARE IF:  °· You have problems with the medicines you were prescribed. °· Your medicines are not working. °· You have a change from the usual headache. °· You have nausea or vomiting. °SEEK IMMEDIATE MEDICAL CARE IF:  °· Your headache becomes severe. °· You have a fever. °· You have a stiff neck. °· You have loss of vision. °· You have muscular weakness or loss of muscle control. °· You start losing your balance or have trouble walking. °· You feel faint or pass out. °· You have severe symptoms that are different from your first symptoms. °MAKE SURE YOU:  °· Understand these  instructions. °· Will watch your condition. °· Will get help right away if you are not doing well or get worse. °Document Released: 04/16/2005 Document Revised: 07/09/2011 Document Reviewed: 05/02/2011 °ExitCare® Patient Information ©2015 [employer]. This information is not intended to replace advice given to you by your health care provider. Make sure you discuss any questions you have with your health care provider. ° °Metoclopramide tablets °What is this medicine? °METOCLOPRAMIDE (met oh kloe PRA mide) is used to treat the symptoms of gastroesophageal reflux disease (GERD) like heartburn. It is also used to treat people with slow emptying of the stomach and intestinal tract. °This medicine may be used for other purposes; ask your health care provider or pharmacist if you have questions. °COMMON BRAND NAME(S): Reglan °What should I tell my health care provider before I take this medicine? °They need to know if you have any of these conditions: °-breast cancer °-depression °-diabetes °-heart failure °-high blood pressure °-kidney disease °-liver disease °-Parkinson's disease or a movement disorder °-pheochromocytoma °-seizures °-stomach obstruction, bleeding, or perforation °-an unusual or allergic reaction to metoclopramide, procainamide, sulfites, other medicines, foods, dyes, or preservatives °-pregnant or trying to get pregnant °-breast-feeding °How should I use this medicine? °Take this medicine by mouth with a glass of water. Follow the directions on the prescription label. Take this medicine on an empty stomach, about 30 minutes before eating. Take your doses at regular intervals. Do not take   your medicine more often than directed. Do not stop taking except on the advice of your doctor or health care professional. A special MedGuide will be given to you by the pharmacist with each prescription and refill. Be sure to read this information carefully each time. Talk to your pediatrician regarding the use  of this medicine in children. Special care may be needed. Overdosage: If you think you have taken too much of this medicine contact a poison control center or emergency room at once. NOTE: This medicine is only for you. Do not share this medicine with others. What if I miss a dose? If you miss a dose, take it as soon as you can. If it is almost time for your next dose, take only that dose. Do not take double or extra doses. What may interact with this medicine? -acetaminophen -cyclosporine -digoxin -medicines for blood pressure -medicines for diabetes, including insulin -medicines for hay fever and other allergies -medicines for depression, especially an Monoamine Oxidase Inhibitor (MAOI) -medicines for Parkinson's disease, like levodopa -medicines for sleep or for pain -tetracycline This list may not describe all possible interactions. Give your health care provider a list of all the medicines, herbs, non-prescription drugs, or dietary supplements you use. Also tell them if you smoke, drink alcohol, or use illegal drugs. Some items may interact with your medicine. What should I watch for while using this medicine? It may take a few weeks for your stomach condition to start to get better. However, do not take this medicine for longer than 12 weeks. The longer you take this medicine, and the more you take it, the greater your chances are of developing serious side effects. If you are an elderly patient, a female patient, or you have diabetes, you may be at an increased risk for side effects from this medicine. Contact your doctor immediately if you start having movements you cannot control such as lip smacking, rapid movements of the tongue, involuntary or uncontrollable movements of the eyes, head, arms and legs, or muscle twitches and spasms. Patients and their families should watch out for worsening depression or thoughts of suicide. Also watch out for any sudden or severe changes in feelings  such as feeling anxious, agitated, panicky, irritable, hostile, aggressive, impulsive, severely restless, overly excited and hyperactive, or not being able to sleep. If this happens, especially at the beginning of treatment or after a change in dose, call your doctor. Do not treat yourself for high fever. Ask your doctor or health care professional for advice. You may get drowsy or dizzy. Do not drive, use machinery, or do anything that needs mental alertness until you know how this drug affects you. Do not stand or sit up quickly, especially if you are an older patient. This reduces the risk of dizzy or fainting spells. Alcohol can make you more drowsy and dizzy. Avoid alcoholic drinks. What side effects may I notice from receiving this medicine? Side effects that you should report to your doctor or health care professional as soon as possible: -allergic reactions like skin rash, itching or hives, swelling of the face, lips, or tongue -abnormal production of milk in females -breast enlargement in both males and females -change in the way you walk -difficulty moving, speaking or swallowing -drooling, lip smacking, or rapid movements of the tongue -excessive sweating -fever -involuntary or uncontrollable movements of the eyes, head, arms and legs -irregular heartbeat or palpitations -muscle twitches and spasms -unusually weak or tired Side effects that usually do not  require medical attention (report to your doctor or health care professional if they continue or are bothersome): -change in sex drive or performance -depressed mood -diarrhea -difficulty sleeping -headache -menstrual changes -restless or nervous This list may not describe all possible side effects. Call your doctor for medical advice about side effects. You may report side effects to FDA at 1-800-FDA-1088. Where should I keep my medicine? Keep out of the reach of children. Store at room temperature between 20 and 25 degrees C  (68 and 77 degrees F). Protect from light. Keep container tightly closed. Throw away any unused medicine after the expiration date. NOTE: This sheet is a summary. It may not cover all possible information. If you have questions about this medicine, talk to your doctor, pharmacist, or health care provider.  2015, [employer]. (2011-08-14 13:04:38)

## 2013-12-09 NOTE — ED Provider Notes (Signed)
CSN: [id number]     Arrival date & time 12/09/13  0315 History   First MD Initiated Contact with Patient 12/09/13 [phone]     Chief Complaint  Patient presents with  . Headache  . Abdominal Pain     (Consider location/radiation/quality/duration/timing/severity/associated sxs/prior Treatment) Patient is a 23 y.o. female presenting with headaches and abdominal pain. The history is provided by the patient.  Headache Associated symptoms: abdominal pain   Abdominal Pain She complains of onset yesterday afternoon of a left retro-orbital headache. Headache is described as throbbing and she rates pain at 7/10. There is associated photophobia. There is associated nausea and vomiting. She denies visual change and denies any aura. She has had headaches like this before. She is also complaining of some abdominal cramping. There's been no diarrhea. She denies fever chills or sweats. She took a dose of Tylenol Sinus with no relief.  Past Medical History  Diagnosis Date  . Asthma   . Anemia   . Hypoglycemia    No past surgical history on file. Family History  Problem Relation Age of Onset  . Cancer Mother   . Hypertension Father    History  Substance Use Topics  . Smoking status: Never Smoker   . Smokeless tobacco: Never Used  . Alcohol Use: Yes     Comment: occasional    OB History   Grav Para Term Preterm Abortions TAB SAB Ect Mult Living   0              Review of Systems  Gastrointestinal: Positive for abdominal pain.  Neurological: Positive for headaches.  All other systems reviewed and are negative.     Allergies  Fexofenadine hcl and Zyrtec  Home Medications   Prior to Admission medications   Medication Sig Start Date End Date Taking? Authorizing Provider  IRON PO Take 1 tablet by mouth daily.     Yes Historical Provider, MD  naproxen sodium (ANAPROX) 220 MG tablet Take 440 mg by mouth 2 (two) times daily as needed (pain).   Yes Historical Provider, MD   BP 110/48   Pulse 83  Resp 16  SpO2 100% Physical Exam  Nursing note and vitals reviewed.  23 year old female, resting comfortably and in no acute distress. Vital signs are normal. Oxygen saturation is 100%, which is normal. Head is normocephalic and atraumatic. PERRLA, EOMI. Oropharynx is clear. Fundi show no hemorrhage, exudate, or papilledema. Neck is nontender and supple without adenopathy or JVD. Back is nontender and there is no CVA tenderness. Lungs are clear without rales, wheezes, or rhonchi. Chest is nontender. Heart has regular rate and rhythm without murmur. Abdomen is soft, flat, nontender without masses or hepatosplenomegaly and peristalsis is hypoactive. Extremities have no cyanosis or edema, full range of motion is present. Skin is warm and dry without rash. Neurologic: Mental status is normal, cranial nerves are intact, there are no motor or sensory deficits.  ED Course  Procedures (including critical care time)  MDM   Final diagnoses:  Headache, unspecified headache type    Headache which seems to be a migraine variant. Report of abdominal pain but benign abdominal exam. No indication for laboratory testing. She will be given IV fluids, IV diphenhydramine, and IV metoclopramide and reassessed.  She feels much better following the above-noted treatment and is discharged with prescription for metoclopramide.  Dione Boozeavid Marcellius Montagna, MD 12/09/13 [phone]

## 2014-08-17 ENCOUNTER — Emergency Department (HOSPITAL_COMMUNITY)
Admission: EM | Admit: 2014-08-17 | Discharge: 2014-08-18 | Disposition: A | Discharge status: Home / Self Care | Payer: No Typology Code available for payment source | Attending: Emergency Medicine | Admitting: Emergency Medicine

## 2014-08-17 DIAGNOSIS — R0789 Other chest pain: Secondary | ICD-10-CM | POA: Diagnosis not present

## 2014-08-17 DIAGNOSIS — D649 Anemia, unspecified: Secondary | ICD-10-CM | POA: Diagnosis not present

## 2014-08-17 DIAGNOSIS — Z79899 Other long term (current) drug therapy: Secondary | ICD-10-CM | POA: Diagnosis not present

## 2014-08-17 DIAGNOSIS — Z8639 Personal history of other endocrine, nutritional and metabolic disease: Secondary | ICD-10-CM | POA: Insufficient documentation

## 2014-08-17 DIAGNOSIS — G43009 Migraine without aura, not intractable, without status migrainosus: Secondary | ICD-10-CM

## 2014-08-17 DIAGNOSIS — G43909 Migraine, unspecified, not intractable, without status migrainosus: Secondary | ICD-10-CM | POA: Insufficient documentation

## 2014-08-17 DIAGNOSIS — J45909 Unspecified asthma, uncomplicated: Secondary | ICD-10-CM | POA: Diagnosis not present

## 2014-08-17 DIAGNOSIS — R51 Headache: Secondary | ICD-10-CM | POA: Diagnosis present

## 2014-08-17 DIAGNOSIS — R079 Chest pain, unspecified: Secondary | ICD-10-CM

## 2014-08-18 ENCOUNTER — Emergency Department (HOSPITAL_COMMUNITY): Payer: No Typology Code available for payment source

## 2014-08-18 ENCOUNTER — Encounter (HOSPITAL_COMMUNITY): Payer: Self-pay

## 2014-08-18 MED ORDER — KETOROLAC TROMETHAMINE 30 MG/ML IJ SOLN
30.0000 mg | Freq: Once | INTRAMUSCULAR | Status: AC
  Administered 2014-08-18: 30 mg via INTRAVENOUS
  Filled 2014-08-18: qty 1

## 2014-08-18 MED ORDER — BUTALBITAL-APAP-CAFFEINE 50-325-40 MG PO TABS: 1.0000 | ORAL_TABLET | Freq: Four times a day (QID) | ORAL | Status: DC | PRN

## 2014-08-18 MED ORDER — DIPHENHYDRAMINE HCL 50 MG/ML IJ SOLN
50.0000 mg | Freq: Once | INTRAMUSCULAR | Status: AC
  Administered 2014-08-18: 50 mg via INTRAVENOUS
  Filled 2014-08-18: qty 1

## 2014-08-18 MED ORDER — SODIUM CHLORIDE 0.9 % IV BOLUS (SEPSIS)
1000.0000 mL | Freq: Once | INTRAVENOUS | Status: AC
  Administered 2014-08-18: 1000 mL via INTRAVENOUS

## 2014-08-18 MED ORDER — METOCLOPRAMIDE HCL 5 MG/ML IJ SOLN
10.0000 mg | Freq: Once | INTRAMUSCULAR | Status: AC
  Administered 2014-08-18: 10 mg via INTRAVENOUS
  Filled 2014-08-18: qty 2

## 2014-08-18 MED ORDER — PROMETHAZINE HCL 25 MG PO TABS: 25.0000 mg | ORAL_TABLET | Freq: Four times a day (QID) | ORAL | Status: DC | PRN

## 2014-08-18 MED ORDER — DEXAMETHASONE SODIUM PHOSPHATE 10 MG/ML IJ SOLN
10.0000 mg | Freq: Once | INTRAMUSCULAR | Status: AC
  Administered 2014-08-18: 10 mg via INTRAVENOUS
  Filled 2014-08-18: qty 1

## 2014-08-18 NOTE — ED Provider Notes (Signed)
TIME SEEN: 1:15 AM  CHIEF COMPLAINT: Headache, chest pain, sore throat  HPI: Pt is a 24 y.o. female with history of asthma, migraines who presents to the emergency department with multiple complaints. Reports over the past several weeks she has had intermittent throbbing left-sided headaches, chest pain that is worse with palpation and sore throat. No shortness of breath. No fevers or cough. No vomiting or diarrhea. No numbness, tingling or focal weakness. Headaches are worse with lights and sounds. No radiation of pain. Reports she has taken NSAIDs at home with some relief. Patient's chest pain has worsened with palpation. It is not exertional or pleuritic. Denies history of PE, DVT, recent prolonged immobilization such as long flight or hospitalization, fracture, surgery, trauma. Denies tobacco use or exogenous estrogen use. No family history of premature CAD. Patient also having sore throat but no fever. No cough. No pain with movement of the neck.  ROS: See HPI Constitutional: no fever  Eyes: no drainage  ENT: no runny nose   Cardiovascular:   chest pain  Resp: no SOB  GI: no vomiting GU: no dysuria Integumentary: no rash  Allergy: no hives  Musculoskeletal: no leg swelling  Neurological: no slurred speech ROS otherwise negative  PAST MEDICAL HISTORY/PAST SURGICAL HISTORY:  Past Medical History  Diagnosis Date  . Asthma   . Anemia   . Hypoglycemia     MEDICATIONS:  Prior to Admission medications   Medication Sig Start Date End Date Taking? Authorizing Provider  IRON PO Take 1 tablet by mouth daily.      Historical Provider, MD  metoCLOPramide (REGLAN) 10 MG tablet Take 1 tablet (10 mg total) by mouth every 6 (six) hours as needed for nausea (or headache). 12/09/13   Dione Booze, MD  naproxen sodium (ANAPROX) 220 MG tablet Take 440 mg by mouth 2 (two) times daily as needed (pain).    Historical Provider, MD    ALLERGIES:  Allergies  Allergen Reactions  . Fexofenadine Hcl  Other (See Comments)    Migraines   . Zyrtec [Cetirizine Hcl] Other (See Comments)    migraines    SOCIAL HISTORY:  History  Substance Use Topics  . Smoking status: Never Smoker   . Smokeless tobacco: Never Used  . Alcohol Use: Yes     Comment: occasional     FAMILY HISTORY: Family History  Problem Relation Age of Onset  . Cancer Mother   . Hypertension Father     EXAM: BP 123/65 mmHg  Pulse 94  Temp(Src) 97.9 F (36.6 C) (Oral)  Resp 18  SpO2 100%  LMP 07/20/2014 CONSTITUTIONAL: Alert and oriented and responds appropriately to questions. Well-appearing; well-nourished HEAD: Normocephalic EYES: Conjunctivae clear, PERRL, patient does have photophobia ENT: normal nose; no rhinorrhea; moist mucous membranes; pharynx without lesions noted; no tonsillar hypertrophy or exudate, no uvular deviation, no trismus or drooling, normal phonation, no stridor NECK: Supple, no meningismus, no LAD; no masses or thyromegaly CARD: RRR; S1 and S2 appreciated; no murmurs, no clicks, no rubs, no gallops RESP: Normal chest excursion without splinting or tachypnea; breath sounds clear and equal bilaterally; no wheezes, no rhonchi, no rales, chest wall tenderness palpation without crepitus or ecchymosis or deformity ABD/GI: Normal bowel sounds; non-distended; soft, non-tender, no rebound, no guarding BACK:  The back appears normal and is non-tender to palpation, there is no CVA tenderness EXT: Normal ROM in all joints; non-tender to palpation; no edema; normal capillary refill; no cyanosis, no calf tenderness or swelling  SKIN: Normal color for age and race; warm NEURO: Moves all extremities equally, sensation to light touch intact diffusely, cranial nerves II through XII intact PSYCH: The patient's mood and manner are appropriate. Grooming and personal hygiene are appropriate.  MEDICAL DECISION MAKING: Patient here with multiple complaints. Migraine has improved with Toradol, Reglan, IV  fluids, Benadryl. She is neurologically intact. No fever. No meningismus. Has had similar headaches with her migraines in the past. Also here with what appears to be chest wall pain. EKG shows no ischemic changes, interval changes or arrhythmia. Chest x-ray clear. She is PERC negative and has a heart score of 0. Patient also complaining of throat pain. No signs of pharyngitis on exam. Not concerned for deep space neck infection, peritonsillar abscess. We'll discharge with prescription for Fioricet, Phenergan. Have advised her to follow-up with outpatient primary care doctor given her multiple complaints that have been present for several weeks. Discussed return precautions. She verbalized understanding and is comfortable with plan.       EKG Interpretation  Date/Time:  Wednesday August 18 2014 00:22:14 EDT Ventricular Rate:  88 PR Interval:  167 QRS Duration: 81 QT Interval:  370 QTC Calculation: 448 R Axis:   66 Text Interpretation:  Sinus rhythm No significant change since last tracing Confirmed by Tyress Loden,  DO, Hulet Ehrmann [phone]([id number]) on 08/18/2014 12:27:02 AM        Layla MawKristen N Prithvi Kooi, DO 08/18/14 [id number]

## 2014-08-18 NOTE — Discharge Instructions (Signed)
Chest Wall Pain °Chest wall pain is pain in or around the bones and muscles of your chest. It may take up to 6 weeks to get better. It may take longer if you must stay physically active in your work and activities.  °CAUSES  °Chest wall pain may happen on its own. However, it may be caused by: °· A viral illness like the flu. °· Injury. °· Coughing. °· Exercise. °· Arthritis. °· Fibromyalgia. °· Shingles. °HOME CARE INSTRUCTIONS  °· Avoid overtiring physical activity. Try not to strain or perform activities that cause pain. This includes any activities using your chest or your abdominal and side muscles, especially if heavy weights are used. °· Put ice on the sore area. °· Put ice in a plastic bag. °· Place a towel between your skin and the bag. °· Leave the ice on for 15-20 minutes per hour while awake for the first 2 days. °· Only take over-the-counter or prescription medicines for pain, discomfort, or fever as directed by your caregiver. °SEEK IMMEDIATE MEDICAL CARE IF:  °· Your pain increases, or you are very uncomfortable. °· You have a fever. °· Your chest pain becomes worse. °· You have new, unexplained symptoms. °· You have nausea or vomiting. °· You feel sweaty or lightheaded. °· You have a cough with phlegm (sputum), or you cough up blood. °MAKE SURE YOU:  °· Understand these instructions. °· Will watch your condition. °· Will get help right away if you are not doing well or get worse. °Document Released: 04/16/2005 Document Revised: 07/09/2011 Document Reviewed: 12/11/2010 °ExitCare® Patient Information ©2015 [employer]. This information is not intended to replace advice given to you by your health care provider. Make sure you discuss any questions you have with your health care provider. ° ° °Migraine Headache °A migraine headache is an intense, throbbing pain on one or both sides of your head. A migraine can last for 30 minutes to several hours. °CAUSES  °The exact cause of a migraine headache is not  always known. However, a migraine may be caused when nerves in the brain become irritated and release chemicals that cause inflammation. This causes pain. °Certain things may also trigger migraines, such as: °· Alcohol. °· Smoking. °· Stress. °· Menstruation. °· Aged cheeses. °· Foods or drinks that contain nitrates, glutamate, aspartame, or tyramine. °· Lack of sleep. °· Chocolate. °· Caffeine. °· Hunger. °· Physical exertion. °· Fatigue. °· Medicines used to treat chest pain (nitroglycerine), birth control pills, estrogen, and some blood pressure medicines. °SIGNS AND SYMPTOMS °· Pain on one or both sides of your head. °· Pulsating or throbbing pain. °· Severe pain that prevents daily activities. °· Pain that is aggravated by any physical activity. °· Nausea, vomiting, or both. °· Dizziness. °· Pain with exposure to bright lights, loud noises, or activity. °· General sensitivity to bright lights, loud noises, or smells. °Before you get a migraine, you may get warning signs that a migraine is coming (aura). An aura may include: °· Seeing flashing lights. °· Seeing bright spots, halos, or zigzag lines. °· Having tunnel vision or blurred vision. °· Having feelings of numbness or tingling. °· Having trouble talking. °· Having muscle weakness. °DIAGNOSIS  °A migraine headache is often diagnosed based on: °· Symptoms. °· Physical exam. °· A CT scan or MRI of your head. These imaging tests cannot diagnose migraines, but they can help rule out other causes of headaches. °TREATMENT °Medicines may be given for pain and nausea. Medicines can also   be given to help prevent recurrent migraines.  °HOME CARE INSTRUCTIONS °· Only take over-the-counter or prescription medicines for pain or discomfort as directed by your health care provider. The use of long-term narcotics is not recommended. °· Lie down in a dark, quiet room when you have a migraine. °· Keep a journal to find out what may trigger your migraine headaches. For  example, write down: °¨ What you eat and drink. °¨ How much sleep you get. °¨ Any change to your diet or medicines. °· Limit alcohol consumption. °· Quit smoking if you smoke. °· Get 7-9 hours of sleep, or as recommended by your health care provider. °· Limit stress. °· Keep lights dim if bright lights bother you and make your migraines worse. °SEEK IMMEDIATE MEDICAL CARE IF:  °· Your migraine becomes severe. °· You have a fever. °· You have a stiff neck. °· You have vision loss. °· You have muscular weakness or loss of muscle control. °· You start losing your balance or have trouble walking. °· You feel faint or pass out. °· You have severe symptoms that are different from your first symptoms. °MAKE SURE YOU:  °· Understand these instructions. °· Will watch your condition. °· Will get help right away if you are not doing well or get worse. °Document Released: 04/16/2005 Document Revised: 08/31/2013 Document Reviewed: 12/22/2012 °ExitCare® Patient Information ©2015 [employer]. This information is not intended to replace advice given to you by your health care provider. Make sure you discuss any questions you have with your health care provider. ° °

## 2014-08-18 NOTE — ED Notes (Signed)
Pt presents with c/o chest pain that started a couple of weeks but has progressively gotten worse. Pt also c/o dizziness and a headache. Pt reports she is light sensitive.

## 2014-08-18 NOTE — ED Notes (Signed)
Patient back from CXR and remains in NAD 

## 2014-11-09 ENCOUNTER — Telehealth: Payer: Self-pay | Admitting: Neurology

## 2014-11-09 ENCOUNTER — Ambulatory Visit (INDEPENDENT_AMBULATORY_CARE_PROVIDER_SITE_OTHER): Payer: 59 | Admitting: Neurology

## 2014-11-09 ENCOUNTER — Encounter: Payer: Self-pay | Admitting: Neurology

## 2014-11-09 VITALS — BP 119/77 | HR 86 | Temp 98.3°F | Ht 68.0 in | Wt 190.6 lb

## 2014-11-09 DIAGNOSIS — R200 Anesthesia of skin: Secondary | ICD-10-CM

## 2014-11-09 DIAGNOSIS — H9190 Unspecified hearing loss, unspecified ear: Secondary | ICD-10-CM | POA: Insufficient documentation

## 2014-11-09 DIAGNOSIS — R112 Nausea with vomiting, unspecified: Secondary | ICD-10-CM | POA: Diagnosis not present

## 2014-11-09 DIAGNOSIS — H905 Unspecified sensorineural hearing loss: Secondary | ICD-10-CM

## 2014-11-09 DIAGNOSIS — R51 Headache: Secondary | ICD-10-CM | POA: Diagnosis not present

## 2014-11-09 DIAGNOSIS — R202 Paresthesia of skin: Secondary | ICD-10-CM

## 2014-11-09 DIAGNOSIS — G43709 Chronic migraine without aura, not intractable, without status migrainosus: Secondary | ICD-10-CM | POA: Diagnosis not present

## 2014-11-09 DIAGNOSIS — H538 Other visual disturbances: Secondary | ICD-10-CM | POA: Diagnosis not present

## 2014-11-09 DIAGNOSIS — R519 Headache, unspecified: Secondary | ICD-10-CM | POA: Insufficient documentation

## 2014-11-09 MED ORDER — VERAPAMIL HCL ER 120 MG PO TBCR: 120.0000 mg | EXTENDED_RELEASE_TABLET | Freq: Every day | ORAL | Status: DC

## 2014-11-09 MED ORDER — ONDANSETRON 4 MG PO TBDP: 4.0000 mg | ORAL_TABLET | ORAL | Status: DC | PRN

## 2014-11-09 MED ORDER — SUMATRIPTAN SUCCINATE 100 MG PO TABS: 100.0000 mg | ORAL_TABLET | Freq: Once | ORAL | Status: DC

## 2014-11-09 NOTE — Telephone Encounter (Signed)
I called back.  Spoke with Mia.  Verified Rx.  They will proceed with order and call us back if anything further is needed.
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
## 2014-11-09 NOTE — Telephone Encounter (Signed)
Candace Vasquez called and requested to verify dosage on medication Rx.  ondansetron (ZOFRAN ODT) 4 MG disintegrating tablet. Please call and advise.

## 2014-11-09 NOTE — Patient Instructions (Signed)
Overall you are doing fairly well but I do want to suggest a few things today:   Remember to drink plenty of fluid, eat healthy meals and do not skip any meals. Try to eat protein with a every meal and eat a healthy snack such as fruit or nuts in between meals. Try to keep a regular sleep-wake schedule and try to exercise daily, particularly in the form of walking, 20-30 minutes a day, if you can.   As far as your medications are concerned, I would like to suggest Verapamil 120mg  daily for migraine prevention Zofran(odansetron) as needed for nausea Imitrex or Relpax at onset of migraine. Please take one tablet at the onset of your headache. If it does not improve the symptoms please take one additional tablet. Do not take more then 2 tablets in 24hrs. Do not take use more then 2 to 3 times in a week.  As far as diagnostic testing: MRI of the brain, Labs  I would like to see you back in 3 months, sooner if we need to. Please call us with any interim questions, concerns, problems, updates or refill requests.   Please also call us for any test results so we can go over those with you on the phone.  My clinical assistant and will answer any of your questions and relay your messages to me and also relay most of my messages to you.   Our phone number is [phone]. We also have an after hours call service for urgent matters and there is a physician on-call for urgent questions. For any emergencies you know to call 911 or go to the nearest emergency room

## 2014-11-11 LAB — CBC
Hematocrit: 36.3 % (ref 34.0–46.6)
Hemoglobin: 11.3 g/dL (ref 11.1–15.9)
MCH: 25.4 pg — ABNORMAL LOW (ref 26.6–33.0)
MCHC: 31.1 g/dL — AB (ref 31.5–35.7)
MCV: 82 fL (ref 79–97)
PLATELETS: 314 10*3/uL (ref 150–379)
RBC: 4.45 x10E6/uL (ref 3.77–5.28)
RDW: 14.7 % (ref 12.3–15.4)
WBC: 10.3 10*3/uL (ref 3.4–10.8)

## 2014-11-11 LAB — COMPREHENSIVE METABOLIC PANEL
ALT: 8 IU/L (ref 0–32)
AST: 22 IU/L (ref 0–40)
Albumin/Globulin Ratio: 1.5 (ref 1.1–2.5)
Albumin: 4.5 g/dL (ref 3.5–5.5)
Alkaline Phosphatase: 63 IU/L (ref 39–117)
BUN/Creatinine Ratio: 15 (ref 8–20)
BUN: 11 mg/dL (ref 6–20)
CO2: 22 mmol/L (ref 18–29)
Calcium: 9.4 mg/dL (ref 8.7–10.2)
Chloride: 101 mmol/L (ref 97–108)
Creatinine, Ser: 0.71 mg/dL (ref 0.57–1.00)
GFR calc Af Amer: 137 mL/min/{1.73_m2} (ref >59)
GFR calc non Af Amer: 119 mL/min/{1.73_m2} (ref >59)
GLUCOSE: 86 mg/dL (ref 65–99)
Globulin, Total: 3 g/dL (ref 1.5–4.5)
Potassium: 4.7 mmol/L (ref 3.5–5.2)
Sodium: 140 mmol/L (ref 134–144)
TOTAL PROTEIN: 7.5 g/dL (ref 6.0–8.5)

## 2014-11-12 ENCOUNTER — Telehealth: Payer: Self-pay

## 2014-11-12 NOTE — Telephone Encounter (Signed)
-----   Message from Anson FretAntonia B Ahern, MD sent at 11/11/2014  6:27 PM EDT ----- Let patient know her labs were unremarkable thanks

## 2014-11-12 NOTE — Telephone Encounter (Signed)
Left message that blood work is normal. Left call back number for any further questions.

## 2014-11-17 ENCOUNTER — Other Ambulatory Visit: Payer: 59

## 2014-11-24 ENCOUNTER — Other Ambulatory Visit: Payer: 59

## 2014-12-01 ENCOUNTER — Other Ambulatory Visit: Payer: 59

## 2014-12-25 ENCOUNTER — Emergency Department (HOSPITAL_COMMUNITY)
Admission: EM | Admit: 2014-12-25 | Discharge: 2014-12-25 | Disposition: A | Discharge status: Home / Self Care | Payer: No Typology Code available for payment source | Attending: Emergency Medicine | Admitting: Emergency Medicine

## 2014-12-25 ENCOUNTER — Encounter (HOSPITAL_COMMUNITY): Payer: Self-pay | Admitting: Emergency Medicine

## 2014-12-25 DIAGNOSIS — G629 Polyneuropathy, unspecified: Secondary | ICD-10-CM | POA: Insufficient documentation

## 2014-12-25 DIAGNOSIS — Z862 Personal history of diseases of the blood and blood-forming organs and certain disorders involving the immune mechanism: Secondary | ICD-10-CM | POA: Insufficient documentation

## 2014-12-25 DIAGNOSIS — G43909 Migraine, unspecified, not intractable, without status migrainosus: Secondary | ICD-10-CM | POA: Insufficient documentation

## 2014-12-25 DIAGNOSIS — H5710 Ocular pain, unspecified eye: Secondary | ICD-10-CM | POA: Insufficient documentation

## 2014-12-25 DIAGNOSIS — J45909 Unspecified asthma, uncomplicated: Secondary | ICD-10-CM | POA: Insufficient documentation

## 2014-12-25 DIAGNOSIS — M79601 Pain in right arm: Secondary | ICD-10-CM | POA: Insufficient documentation

## 2014-12-25 MED ORDER — IBUPROFEN 200 MG PO TABS
600.0000 mg | ORAL_TABLET | Freq: Once | ORAL | Status: AC
  Administered 2014-12-25: 600 mg via ORAL
  Filled 2014-12-25: qty 3

## 2014-12-25 NOTE — Discharge Instructions (Signed)
Please follow-up with your neurologist. Continue to take motrin and tylenol for symptoms. Return for worsening symptoms, including weakness in your hand, worsening pain, vision changes, or any other symptoms concerning to you. Wear your cock up wrist splint, especially at night to see if it will help with some of your symptoms.   Peripheral Neuropathy Peripheral neuropathy is a type of nerve damage. It affects nerves that carry signals between the spinal cord and other parts of the body. These are called peripheral nerves. With peripheral neuropathy, one nerve or a group of nerves may be damaged.  CAUSES  Many things can damage peripheral nerves. For some people with peripheral neuropathy, the cause is unknown. Some causes include:  Diabetes. This is the most common cause of peripheral neuropathy.  Injury to a nerve.  Pressure or stress on a nerve that lasts a long time.  Too little vitamin B. Alcoholism can lead to this.  Infections.  Autoimmune diseases, such as multiple sclerosis and systemic lupus erythematosus.  Inherited nerve diseases.  Some medicines, such as cancer drugs.  Toxic substances, such as lead and mercury.  Too little blood flowing to the legs.  Kidney disease.  Thyroid disease. SIGNS AND SYMPTOMS  Different people have different symptoms. The symptoms you have will depend on which of your nerves is damaged. Common symptoms include:  Loss of feeling (numbness) in the feet and hands.  Tingling in the feet and hands.  Pain that burns.  Very sensitive skin.  Weakness.  Not being able to move a part of the body (paralysis).  Muscle twitching.  Clumsiness or poor coordination.  Loss of balance.  Not being able to control your bladder.  Feeling dizzy.  Sexual problems. DIAGNOSIS  Peripheral neuropathy is a symptom, not a disease. Finding the cause of peripheral neuropathy can be hard. To figure that out, your health care provider will take a  medical history and do a physical exam. A neurological exam will also be done. This involves checking things affected by your brain, spinal cord, and nerves (nervous system). For example, your health care provider will check your reflexes, how you move, and what you can feel.  Other types of tests may also be ordered, such as:  Blood tests.  A test of the fluid in your spinal cord.  Imaging tests, such as CT scans or an MRI.  Electromyography (EMG). This test checks the nerves that control muscles.  Nerve conduction velocity tests. These tests check how fast messages pass through your nerves.  Nerve biopsy. A small piece of nerve is removed. It is then checked under a microscope. TREATMENT   Medicine is often used to treat peripheral neuropathy. Medicines may include:  Pain-relieving medicines. Prescription or over-the-counter medicine may be suggested.  Antiseizure medicine. This may be used for pain.  Antidepressants. These also may help ease pain from neuropathy.  Lidocaine. This is a numbing medicine. You might wear a patch or be given a shot.  Mexiletine. This medicine is typically used to help control irregular heart rhythms.  Surgery. Surgery may be needed to relieve pressure on a nerve or to destroy a nerve that is causing pain.  Physical therapy to help movement.  Assistive devices to help movement. HOME CARE INSTRUCTIONS   Only take over-the-counter or prescription medicines as directed by your health care provider. Follow the instructions carefully for any given medicines. Do not take any other medicines without first getting approval from your health care provider.  If you have  diabetes, work closely with your health care provider to keep your blood sugar under control.  If you have numbness in your feet:  Check every day for signs of injury or infection. Watch for redness, warmth, and swelling.  Wear padded socks and comfortable shoes. These help protect your  feet.  Do not do things that put pressure on your damaged nerve.  Do not smoke. Smoking keeps blood from getting to damaged nerves.  Avoid or limit alcohol. Too much alcohol can cause a lack of B vitamins. These vitamins are needed for healthy nerves.  Develop a good support system. Coping with peripheral neuropathy can be stressful. Talk to a mental health specialist or join a support group if you are struggling.  Follow up with your health care provider as directed. SEEK MEDICAL CARE IF:   You have new signs or symptoms of peripheral neuropathy.  You are struggling emotionally from dealing with peripheral neuropathy.  You have a fever. SEEK IMMEDIATE MEDICAL CARE IF:   You have an injury or infection that is not healing.  You feel very dizzy or begin vomiting.  You have chest pain.  You have trouble breathing. Document Released: 04/06/2002 Document Revised: 12/27/2010 Document Reviewed: 12/22/2012 [facility] Patient Information 2015 [place], [place]. This information is not intended to replace advice given to you by your health care provider. Make sure you discuss any questions you have with your health care provider.

## 2014-12-25 NOTE — ED Notes (Signed)
Pt states that since last Sunday she has been having right eye pain and intermittent blurred vision.  Pt denies any glasses or contacts.  Pt also c/o right arm pain and numbness that started couple days ago.  Pt states that sensation in right hand was less sensation when RN was poking both hands at the same time.  Pt has PMH migraines.
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
## 2014-12-25 NOTE — ED Provider Notes (Signed)
CSN: [id number]     Arrival date & time 12/25/14  1836 History   First MD Initiated Contact with Patient 12/25/14 1935     Chief Complaint  Patient presents with  . Arm Pain  . Numbness    rigth arm  . Eye Pain     (Consider location/radiation/quality/duration/timing/severity/associated sxs/prior Treatment) HPI  24 year old female who presents with eye pain and burning pain in the right arm. History of asthma and migraine headaches.  She reports having a history of right arm tingling and burning pain in the past, but usually shorter lasting concurrent symptoms. 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 Reports intermittent episodes of burning and tingling and pain from her elbow down to her fingertips.  Not associated with weakness. No recent injury or falls. No neck pain, headache, nausea, or vomiting. In regards to her eye pain, she reports that several days ago she developed pain over her cheek underneath the right eye. Has not had any congestion, sore throat, runny nose, cough, or other URI symptoms. Denies any vision changes, or pain with movement of her eye, or eye redness.   Past Medical History  Diagnosis Date  . Asthma   . Anemia   . Hypoglycemia   . Migraine    Past Surgical History  Procedure Laterality Date  . 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 Tubes in ear      Came out 6th grade  . Eye surgery Left    Family History  Problem Relation Age of Onset  . Cancer Mother   . Hypertension Father   . Diabetes Father   . Asthma Mother   . Migraines Mother   . Breast cancer Maternal Grandmother   . Hypertension Paternal Grandmother   . Hypertension Maternal Grandmother   . Glaucoma     Social History  Substance Use Topics  . Smoking status: Never Smoker   . Smokeless tobacco: Never Used  . Alcohol Use: Yes     Comment: occasional    OB History    Gravida  Para Term Preterm AB TAB SAB Ectopic Multiple Living   0              Review of Systems 10/14 systems reviewed and are negative other than those stated in the HPI   Allergies  Fexofenadine hcl; Other; and Zyrtec  Home Medications   Prior to Admission medications   Medication Sig Start Date End Date Taking? Authorizing Provider  ibuprofen (ADVIL,MOTRIN) 200 MG tablet Take 400 mg by mouth every 6 (six) hours as needed for moderate pain.   Yes Historical Provider, MD  SUMAtriptan (IMITREX) 100 MG tablet Take 1 tablet (100 mg total) by mouth once. May repeat in 2 hours if headache persists or recurs. 11/09/14  Yes Anson Fret, MD  verapamil (CALAN-SR) 120 MG CR tablet Take 1 tablet (120 mg total) by mouth at bedtime. 11/09/14  Yes Anson Fret, MD  ondansetron (ZOFRAN ODT) 4 MG disintegrating tablet Take 1 tablet (4 mg total) by mouth every 2 (two) hours as needed for nausea or vomiting. Make take 1-2 tabs 11/09/14   Anson Fret, MD   BP 112/69 mmHg  Pulse 100  Temp(Src) 98.2 F (36.8 C) (Oral)  Resp 18  SpO2 100%  LMP 12/11/2014 Physical Exam Physical Exam  Nursing note and vitals reviewed. Constitutional:  Well developed, well nourished, non-toxic, and in no acute distress Head: Normocephalic and atraumatic. Tender to palpation over right maxillary sinus Eye: normal conjunctiva Mouth/Throat: Oropharynx is clear and moist.  Neck: Normal range of motion. Neck supple.  Cardiovascular: Normal rate and regular rhythm. No edema.   Pulmonary/Chest: Effort normal and breath sounds normal.  Abdominal: Soft. There is no tenderness. There is no rebound and no guarding.  Musculoskeletal: Normal range of motion.  Neurological: Alert, no facial droop, fluent speech, moves all extremities symmetrically, PERRL, EOMI Reported tingling involving median nerve distribution of the right hand, produced with tapping of the median nerve at the level of the rest Shooting burning pain and  paresthesia with palpation of the ulnar nerve at the medial epicondyle  At the right elbow Skin: Skin is warm and dry.  Psychiatric: Cooperative  ED Course  Procedures (including critical care time)   MDM   Final diagnoses:  Peripheral neuropathy     in short, this is a 24 year old female who presents with periorbital pain and right arm pain/numbness. She is nontoxic and in no acute distress. Vital signs are overall non-concerning. Overall I do not think her periorbital pain is associated with her right arm pain or numbness. She has a normal ocular exam, and her pain is primarily over the maxillary sinus, which she reports having issues with her sinuses in the past. I have recommended anti-inflammatory over-the-counter medications for this, and supportive care. No indications for imaging or antibiotics at this time. In regards to her right arm pain and numbness, she reports that I am able to reproduce some of her symptoms with palpation of the ulnar nerve at the medial epicondyles of the right elbow. She also reports that some of the numbness of her other digits and palm are associated with tapping of the median nerve. The symptoms do not occur simultaneously.  Her presentation seems consistent with likely peripheral neuropathy. No reports of injury, and symptoms is not c/w with CNS process. She is given a wrist cock-up splint for possible carpal tunnel syndrome. Presentation may also be consistent with cubital tunnel syndrome as well. She will follow-up with her PCP/neurologist regarding any further work-up.    Lavera Guise, MD 12/26/14 [phone]

## 2014-12-30 ENCOUNTER — Emergency Department (HOSPITAL_COMMUNITY)
Admission: EM | Admit: 2014-12-30 | Discharge: 2014-12-31 | Disposition: A | Discharge status: Home / Self Care | Payer: Self-pay | Attending: Emergency Medicine | Admitting: Emergency Medicine

## 2014-12-30 ENCOUNTER — Encounter: Payer: Self-pay | Admitting: Neurology

## 2014-12-30 DIAGNOSIS — R200 Anesthesia of skin: Secondary | ICD-10-CM | POA: Insufficient documentation

## 2014-12-30 DIAGNOSIS — R202 Paresthesia of skin: Secondary | ICD-10-CM

## 2014-12-31 ENCOUNTER — Encounter (HOSPITAL_COMMUNITY): Payer: Self-pay | Admitting: Emergency Medicine

## 2014-12-31 ENCOUNTER — Emergency Department (HOSPITAL_COMMUNITY): Payer: No Typology Code available for payment source

## 2014-12-31 ENCOUNTER — Emergency Department (HOSPITAL_COMMUNITY): Payer: Self-pay

## 2014-12-31 LAB — URINALYSIS, ROUTINE W REFLEX MICROSCOPIC
Bilirubin Urine: NEGATIVE
GLUCOSE, UA: NEGATIVE mg/dL
Hgb urine dipstick: NEGATIVE
Ketones, ur: NEGATIVE mg/dL
LEUKOCYTES UA: NEGATIVE
Nitrite: NEGATIVE
PROTEIN: NEGATIVE mg/dL
SPECIFIC GRAVITY, URINE: 1.009 (ref 1.005–1.030)
Urobilinogen, UA: 0.2 mg/dL (ref 0.0–1.0)
pH: 5.5 (ref 5.0–8.0)

## 2014-12-31 LAB — DIFFERENTIAL
BASOS PCT: 0 % (ref 0–1)
Basophils Absolute: 0 10*3/uL (ref 0.0–0.1)
EOS ABS: 0.1 10*3/uL (ref 0.0–0.7)
EOS PCT: 1 % (ref 0–5)
Lymphocytes Relative: 19 % (ref 12–46)
Lymphs Abs: 2.3 10*3/uL (ref 0.7–4.0)
MONO ABS: 0.8 10*3/uL (ref 0.1–1.0)
MONOS PCT: 6 % (ref 3–12)
NEUTROS ABS: 8.7 10*3/uL — AB (ref 1.7–7.7)
Neutrophils Relative %: 74 % (ref 43–77)

## 2014-12-31 LAB — COMPREHENSIVE METABOLIC PANEL
ALT: 11 U/L — AB (ref 14–54)
ANION GAP: 7 (ref 5–15)
AST: 20 U/L (ref 15–41)
Albumin: 4 g/dL (ref 3.5–5.0)
Alkaline Phosphatase: 63 U/L (ref 38–126)
BUN: 19 mg/dL (ref 6–20)
CHLORIDE: 109 mmol/L (ref 101–111)
CO2: 22 mmol/L (ref 22–32)
CREATININE: 0.93 mg/dL (ref 0.44–1.00)
Calcium: 8.9 mg/dL (ref 8.9–10.3)
Glucose, Bld: 103 mg/dL — ABNORMAL HIGH (ref 65–99)
POTASSIUM: 3.8 mmol/L (ref 3.5–5.1)
SODIUM: 138 mmol/L (ref 135–145)
Total Bilirubin: 0.3 mg/dL (ref 0.3–1.2)
Total Protein: 7.7 g/dL (ref 6.5–8.1)

## 2014-12-31 LAB — RAPID URINE DRUG SCREEN, HOSP PERFORMED
AMPHETAMINES: NOT DETECTED
BENZODIAZEPINES: NOT DETECTED
Barbiturates: NOT DETECTED
Cocaine: NOT DETECTED
OPIATES: NOT DETECTED
Tetrahydrocannabinol: NOT DETECTED

## 2014-12-31 LAB — CBC
HEMATOCRIT: 33.1 % — AB (ref 36.0–46.0)
Hemoglobin: 10.8 g/dL — ABNORMAL LOW (ref 12.0–15.0)
MCH: 26.3 pg (ref 26.0–34.0)
MCHC: 32.6 g/dL (ref 30.0–36.0)
MCV: 80.5 fL (ref 78.0–100.0)
PLATELETS: 256 10*3/uL (ref 150–400)
RBC: 4.11 MIL/uL (ref 3.87–5.11)
RDW: 14.4 % (ref 11.5–15.5)
WBC: 11.9 10*3/uL — AB (ref 4.0–10.5)

## 2014-12-31 LAB — I-STAT TROPONIN, ED: TROPONIN I, POC: 0 ng/mL (ref 0.00–0.08)

## 2014-12-31 LAB — ETHANOL

## 2014-12-31 LAB — PROTIME-INR
INR: 1.13 (ref 0.00–1.49)
PROTHROMBIN TIME: 14.7 s (ref 11.6–15.2)

## 2014-12-31 LAB — APTT: aPTT: 33 seconds (ref 24–37)

## 2014-12-31 MED ORDER — LORAZEPAM 2 MG/ML IJ SOLN
1.0000 mg | Freq: Once | INTRAMUSCULAR | Status: AC
  Administered 2014-12-31: 1 mg via INTRAVENOUS
  Filled 2014-12-31: qty 1

## 2014-12-31 NOTE — ED Notes (Signed)
Dr. Criss Alvine at pt bedside

## 2014-12-31 NOTE — ED Notes (Signed)
Dr Criss Alvine gave permission to drive herself to [facility] and has spoken to Dr Patria Mane about resuming care of the patient.

## 2014-12-31 NOTE — ED Notes (Signed)
Pt to CT

## 2014-12-31 NOTE — ED Notes (Signed)
MD Patria Mane made aware of patient having Ativan during MRI.  Patient is alert and oriented and ambulatory with steady gait prior to discharge.  Charge RN Moon accessed the patient.  Patient advised that she had no family to call prior to discharge and she was driving home.  Patient did not appear drowsy and advised she was not dizzy during discharge.

## 2014-12-31 NOTE — ED Notes (Signed)
Pt from home states she has had right sided weakness x 1 week. She was recently diagnosed with peripheral neuropathy.

## 2014-12-31 NOTE — ED Provider Notes (Signed)
6:46 AM Pt feels better. MRI negative. Outpatient pcp and neurology follow up  Ct Head Wo Contrast  12/31/2014   CLINICAL DATA:  Initial valuation for acute right facial numbness with right-sided weakness.  EXAM: CT HEAD WITHOUT CONTRAST  TECHNIQUE: Contiguous axial images were obtained from the base of the skull through the vertex without intravenous contrast.  COMPARISON:  Prior study from 06/02/2011.  FINDINGS: There is no acute intracranial hemorrhage or infarct. No mass lesion or midline shift. Gray-white matter differentiation is well maintained. Ventricles are normal in size without evidence of hydrocephalus. CSF containing spaces are within normal limits. No extra-axial fluid collection.  The calvarium is intact.  Orbital soft tissues are within normal limits.  The paranasal sinuses and mastoid air cells are well pneumatized and free of fluid.  Scalp soft tissues are unremarkable.  IMPRESSION: Normal head CT with no acute intracranial process identified.   Electronically Signed   By: Rise Mu M.D.   On: 12/31/2014 02:50   Mr Brain Wo Contrast  12/31/2014   CLINICAL DATA:  Initial evaluation for 1 week history of right-sided numbness.  EXAM: MRI HEAD WITHOUT CONTRAST  TECHNIQUE: Multiplanar, multiecho pulse sequences of the brain and surrounding structures were obtained without intravenous contrast.  COMPARISON:  Prior CT from earlier the same day.  FINDINGS: Examination technically limited by motion artifact.  The CSF containing spaces are within normal limits for patient age. Minimal T2/FLAIR hyperintensity within the periventricular white matter adjacent to the atria of both lateral ventricles, of doubtful clinical significance. No other focal parenchymal signal abnormality. No mass lesion, midline shift, or extra-axial fluid collection. Ventricles are normal in size without evidence of hydrocephalus.  No diffusion-weighted signal abnormality is identified to suggest acute intracranial  infarct. Evaluation somewhat limited due to susceptibility artifact within the bilateral frontal regions. Gray-white matter differentiation is maintained. Normal flow voids are seen within the intracranial vasculature. No intracranial hemorrhage identified.  The cervicomedullary junction is normal. Pituitary gland is within normal limits. Pituitary stalk is midline. The globes and optic nerves demonstrate a normal appearance with normal signal intensity.  The bone marrow signal intensity is normal. Calvarium is intact. Visualized upper cervical spine is within normal limits.  Scalp soft tissues are unremarkable.  Paranasal sinuses are clear.  No mastoid effusion.  IMPRESSION: Negative brain MRI with no acute intracranial process identified.   Electronically Signed   By: Rise Mu M.D.   On: 12/31/2014 05:55   I personally reviewed the imaging tests through PACS system I reviewed available ER/hospitalization records through the EMR   Azalia Bilis, MD 12/31/14 [phone]

## 2014-12-31 NOTE — ED Notes (Signed)
Pt states she was seen here recently and was told she had neuropathy in her right arm  Pt states today the whole right side of her body is numb  Pt states she cannot feel the right side of her face  Pt states she feels short of breath  Pt has hx of asthma  Pt is crying in triage

## 2014-12-31 NOTE — ED Notes (Signed)
Pt c/o R sided numbness with worsening headache. Reports numbness has improved to R forearm only. Splint present to forearm. Pt transferred to ED by POV from [facility] for an MRI.

## 2014-12-31 NOTE — ED Provider Notes (Signed)
CSN: [id number]     Arrival date & time 12/30/14  2355 History   This chart was scribed for Pricilla Loveless, MD by Freida Busman, ED Scribe. This patient was seen in room WA05/WA05 and the patient's care was started 1:32 AM.    Chief Complaint  Patient presents with  . Numbness   The history is provided by the patient. No language interpreter was used.     HPI Comments:  Candace Vasquez is a 24 y.o. female who presents to the Emergency Department complaining of constant right sided numbness for 1 week. She states the numbness started in her right wrist and has now radiated into the entire right arm and the right side of her face. She reports associated intermittent HA for 2 days and occasional blurry vision in her right eye. She notes the change in vision does not coincide with the HA. Pt has a h/o migraine HAs and notes her HA today is dissimlar. She denies h/o similar numbness in the past, weakness in her extremities, CP, and acute injury/trauma. No alleviating factors noted.  Past Medical History  Diagnosis Date  . Asthma   . Anemia   . Hypoglycemia   . Migraine    Past Surgical History  Procedure Laterality Date  . 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 Tubes in ear      Came out 6th grade  . Eye surgery Left    Family History  Problem Relation Age of Onset  . Cancer Mother   . Hypertension Father   . Diabetes Father   . Asthma Mother   . Migraines Mother   . Breast cancer Maternal Grandmother   . Hypertension Paternal Grandmother   . Hypertension Maternal Grandmother   . Glaucoma     Social History  Substance Use Topics  . Smoking status: Never Smoker   . Smokeless tobacco: Never Used  . Alcohol Use: Yes     Comment: occasional    OB History    Gravida Para Term Preterm AB TAB SAB Ectopic Multiple Living   0              Review of Systems  Eyes: Positive for visual disturbance (Blurry vision).  Respiratory: Negative for shortness of breath.    Cardiovascular: Negative for chest pain.  Neurological: Positive for numbness and headaches. Negative for weakness.  All other systems reviewed and are negative.  Allergies  Fexofenadine hcl; Other; and Zyrtec  Home Medications   Prior to Admission medications   Medication Sig Start Date End Date Taking? Authorizing Provider  ibuprofen (ADVIL,MOTRIN) 200 MG tablet Take 400 mg by mouth every 6 (six) hours as needed for moderate pain.   Yes Historical Provider, MD  SUMAtriptan (IMITREX) 100 MG tablet Take 1 tablet (100 mg total) by mouth once. May repeat in 2 hours if headache persists or recurs. 11/09/14  Yes Anson Fret, MD  verapamil (CALAN-SR) 120 MG CR tablet Take 1 tablet (120 mg total) by mouth at bedtime. 11/09/14  Yes Anson Fret, MD  ondansetron (ZOFRAN ODT) 4 MG disintegrating tablet Take 1 tablet (4 mg total) by mouth every 2 (two) hours as needed for nausea or vomiting. Make take 1-2 tabs 11/09/14   Anson Fret, MD   BP 126/78 mmHg  Pulse 104  Temp(Src) 98.4 F (36.9 C) (Oral)  Resp 18  SpO2 100%  LMP 12/17/2014 (Approximate) Physical Exam  Constitutional: She is oriented  to person, place, and time. She appears well-developed and well-nourished.  HENT:  Head: Normocephalic and atraumatic.  Right Ear: External ear normal.  Left Ear: External ear normal.  Nose: Nose normal.  Eyes: EOM are normal. Pupils are equal, round, and reactive to light. Right eye exhibits no discharge. Left eye exhibits no discharge.  Cardiovascular: Normal rate, regular rhythm and normal heart sounds.   Pulmonary/Chest: Effort normal and breath sounds normal.  Abdominal: Soft. There is no tenderness.  Neurological: She is alert and oriented to person, place, and time.  Cranial nerves 2-12 grossly intact except subjective numbness to her right face  5/5 strength all 4 extremities Subjective numbness over right upper extremity diffusely    Skin: Skin is warm and dry.  Nursing note and  vitals reviewed.   ED Course  Procedures   DIAGNOSTIC STUDIES:  Oxygen Saturation is 100% on RA, normal by my interpretation.    COORDINATION OF CARE:  1:41 AM Discussed treatment plan with pt at bedside and pt agreed to plan.  Labs Review Labs Reviewed  CBC - Abnormal; Notable for the following:    WBC 11.9 (*)    Hemoglobin 10.8 (*)    HCT 33.1 (*)    All other components within normal limits  DIFFERENTIAL - Abnormal; Notable for the following:    Neutro Abs 8.7 (*)    All other components within normal limits  COMPREHENSIVE METABOLIC PANEL - Abnormal; Notable for the following:    Glucose, Bld 103 (*)    ALT 11 (*)    All other components within normal limits  ETHANOL  PROTIME-INR  APTT  URINE RAPID DRUG SCREEN, HOSP PERFORMED  URINALYSIS, ROUTINE W REFLEX MICROSCOPIC (NOT AT [facility])  I-STAT CHEM 8, ED  I-STAT TROPOININ, ED    Imaging Review Ct Head Wo Contrast  12/31/2014   CLINICAL DATA:  Initial valuation for acute right facial numbness with right-sided weakness.  EXAM: CT HEAD WITHOUT CONTRAST  TECHNIQUE: Contiguous axial images were obtained from the base of the skull through the vertex without intravenous contrast.  COMPARISON:  Prior study from 06/02/2011.  FINDINGS: There is no acute intracranial hemorrhage or infarct. No mass lesion or midline shift. Gray-white matter differentiation is well maintained. Ventricles are normal in size without evidence of hydrocephalus. CSF containing spaces are within normal limits. No extra-axial fluid collection.  The calvarium is intact.  Orbital soft tissues are within normal limits.  The paranasal sinuses and mastoid air cells are well pneumatized and free of fluid.  Scalp soft tissues are unremarkable.  IMPRESSION: Normal head CT with no acute intracranial process identified.   Electronically Signed   By: Rise Mu M.D.   On: 12/31/2014 02:50   I have personally reviewed and evaluated these images and lab results as  part of my medical decision-making.   EKG Interpretation   Date/Time:  Friday December 31 2014 02:21:37 EDT Ventricular Rate:  90 PR Interval:  166 QRS Duration: 77 QT Interval:  360 QTC Calculation: 440 R Axis:   72 Text Interpretation:  Sinus rhythm Normal ECG no significant change since  April 2016 Confirmed by Criss Alvine  MD, Brigette Hopfer ([id number]) on 12/31/2014 2:34:14 AM      MDM   Final diagnoses:  Right arm numbness  Numbness and tingling of right face    Due to patient's progressive numbness and eye pain/blurry vision (though intermittent and not present now) she will need and MRI to rule out MS vs CVA (less likely).  Minimal headache now so cavernous venous thrombosis is less likely. Unable to get MRI at this facility at this time of night, will transfer to [facility]. Discussed with EDP Dr. Patria Mane who accepts in transfer, plan discussed. Patient declines ambulance, wants to drive over. Instructed her to drive there immediately and discussed risks of driving herself. Besides numbness no acute neuro dysfunction on exam and thus I believe it is safe for her to drive there.   I personally performed the services described in this documentation, which was scribed in my presence. The recorded information has been reviewed and is accurate.    Pricilla Loveless, MD 12/31/14 [phone]

## 2014-12-31 NOTE — ED Notes (Signed)
Report given to [facility] but was delayed due to three level 1 in the department at the time of transfer

## 2015-02-09 ENCOUNTER — Ambulatory Visit: Payer: 59 | Admitting: Neurology

## 2015-02-09 ENCOUNTER — Telehealth: Payer: Self-pay | Admitting: *Deleted

## 2015-02-09 NOTE — Telephone Encounter (Signed)
no showed follow up appointment  

## 2015-02-10 ENCOUNTER — Encounter: Payer: Self-pay | Admitting: Neurology

## 2015-06-30 ENCOUNTER — Emergency Department (HOSPITAL_COMMUNITY)
Admission: EM | Admit: 2015-06-30 | Discharge: 2015-06-30 | Disposition: A | Discharge status: Home / Self Care | Payer: No Typology Code available for payment source | Attending: Emergency Medicine | Admitting: Emergency Medicine

## 2015-06-30 ENCOUNTER — Encounter (HOSPITAL_COMMUNITY): Payer: Self-pay | Admitting: Emergency Medicine

## 2015-06-30 DIAGNOSIS — Z79899 Other long term (current) drug therapy: Secondary | ICD-10-CM | POA: Insufficient documentation

## 2015-06-30 DIAGNOSIS — D649 Anemia, unspecified: Secondary | ICD-10-CM | POA: Insufficient documentation

## 2015-06-30 DIAGNOSIS — K2100 Gastro-esophageal reflux disease with esophagitis, without bleeding: Secondary | ICD-10-CM

## 2015-06-30 DIAGNOSIS — J45909 Unspecified asthma, uncomplicated: Secondary | ICD-10-CM | POA: Insufficient documentation

## 2015-06-30 DIAGNOSIS — Z8639 Personal history of other endocrine, nutritional and metabolic disease: Secondary | ICD-10-CM | POA: Insufficient documentation

## 2015-06-30 DIAGNOSIS — G43909 Migraine, unspecified, not intractable, without status migrainosus: Secondary | ICD-10-CM | POA: Insufficient documentation

## 2015-06-30 LAB — COMPREHENSIVE METABOLIC PANEL
ALBUMIN: 4 g/dL (ref 3.5–5.0)
ALT: 10 U/L — ABNORMAL LOW (ref 14–54)
ANION GAP: 11 (ref 5–15)
AST: 17 U/L (ref 15–41)
Alkaline Phosphatase: 58 U/L (ref 38–126)
BILIRUBIN TOTAL: 0.3 mg/dL (ref 0.3–1.2)
BUN: 14 mg/dL (ref 6–20)
CHLORIDE: 106 mmol/L (ref 101–111)
CO2: 21 mmol/L — AB (ref 22–32)
Calcium: 8.7 mg/dL — ABNORMAL LOW (ref 8.9–10.3)
Creatinine, Ser: 0.72 mg/dL (ref 0.44–1.00)
GFR calc Af Amer: >60 mL/min (ref >60)
GFR calc non Af Amer: >60 mL/min (ref >60)
GLUCOSE: 93 mg/dL (ref 65–99)
POTASSIUM: 3.7 mmol/L (ref 3.5–5.1)
SODIUM: 138 mmol/L (ref 135–145)
TOTAL PROTEIN: 7.5 g/dL (ref 6.5–8.1)

## 2015-06-30 LAB — CBC
HEMATOCRIT: 33.2 % — AB (ref 36.0–46.0)
HEMOGLOBIN: 10.6 g/dL — AB (ref 12.0–15.0)
MCH: 26.8 pg (ref 26.0–34.0)
MCHC: 31.9 g/dL (ref 30.0–36.0)
MCV: 84.1 fL (ref 78.0–100.0)
Platelets: 255 10*3/uL (ref 150–400)
RBC: 3.95 MIL/uL (ref 3.87–5.11)
RDW: 13.8 % (ref 11.5–15.5)
WBC: 11.2 10*3/uL — ABNORMAL HIGH (ref 4.0–10.5)

## 2015-06-30 LAB — LIPASE, BLOOD: LIPASE: 33 U/L (ref 11–51)

## 2015-06-30 MED ORDER — OMEPRAZOLE 20 MG PO CPDR: 20.0000 mg | DELAYED_RELEASE_CAPSULE | Freq: Every day | ORAL | Status: DC

## 2015-06-30 MED ORDER — GI COCKTAIL ~~LOC~~
30.0000 mL | Freq: Once | ORAL | Status: AC
  Administered 2015-06-30: 30 mL via ORAL
  Filled 2015-06-30: qty 30

## 2015-06-30 MED ORDER — FAMOTIDINE 20 MG PO TABS
20.0000 mg | ORAL_TABLET | Freq: Once | ORAL | Status: AC
  Administered 2015-06-30: 20 mg via ORAL
  Filled 2015-06-30: qty 1

## 2015-06-30 MED ORDER — FAMOTIDINE 20 MG PO TABS: 20.0000 mg | ORAL_TABLET | Freq: Two times a day (BID) | ORAL | Status: DC

## 2015-06-30 NOTE — ED Notes (Signed)
Pt states she is having burning in her chest  Pt states she took an acid reducer  Pt states tonight at 11 the burning got worse and it radiates a little to the right  Pt states it also feels like something is stuck in her chest

## 2015-06-30 NOTE — Discharge Instructions (Signed)

## 2015-06-30 NOTE — ED Provider Notes (Signed)
CSN: [id number]     Arrival date & time 06/30/15  0114 History  By signing my name below, I, Budd Palmer, attest that this documentation has been prepared under the direction and in the presence of Eber Hong, MD. Electronically Signed: Budd Palmer, ED Scribe. 06/30/2015. 1:57 AM.     Chief Complaint  Patient presents with  . Chest Pain   The history is provided by the patient. No language interpreter was used.   HPI Comments: Candace Vasquez is a 25 y.o. female with a PMHx of asthma, migraine, anemia, and hypoglycemia who presents to the Emergency Department complaining of intermittent, worsening, burning, epigastric pain radiating through the chest and up to her neck for the past 3 weeks, worsening significantly as of 2.5 hours ago. Pt states the pain typically lasts for a few hours at a time, and occurs about every other day. She also notes she feels as though something is stuck in her chest. She reports associated loss of appetite and upper back pain (resolved). She has tried taking an acid reducer without relief. She notes she had some grilled hibachi chicken tonight. She reports alleviation of the pain after taking off her bra. She denies any recent surgeries as well as any medical issues. She notes she is not on any medications, but does take Aleve frequently for headaches (every other day). She denies drinking alcohol. Pt denies urinary frequency, dysuria, and diarrhea.   Past Medical History  Diagnosis Date  . Asthma   . Anemia   . Hypoglycemia   . Migraine    Past Surgical History  Procedure Laterality Date  . 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 Tubes in ear      Came out 6th grade  . Eye surgery Left    Family History  Problem Relation Age of Onset  . Cancer Mother   . Hypertension Father   . Diabetes Father   . Asthma Mother   . Migraines Mother   . Breast cancer Maternal Grandmother   . Hypertension Paternal Grandmother   . Hypertension  Maternal Grandmother   . Glaucoma     Social History  Substance Use Topics  . Smoking status: Never Smoker   . Smokeless tobacco: Never Used  . Alcohol Use: No     Comment: occasional    OB History    Gravida Para Term Preterm AB TAB SAB Ectopic Multiple Living   0              Review of Systems  Constitutional: Positive for appetite change.  Cardiovascular: Positive for chest pain.  Gastrointestinal: Positive for abdominal pain. Negative for diarrhea.  Genitourinary: Negative for dysuria and frequency.  All other systems reviewed and are negative.   Allergies  Fexofenadine hcl; Other; and Zyrtec  Home Medications   Prior to Admission medications   Medication Sig Start Date End Date Taking? Authorizing Provider  calcium carbonate (TUMS - DOSED IN MG ELEMENTAL CALCIUM) 500 MG chewable tablet Chew 1 tablet by mouth daily.   Yes Historical Provider, MD  eletriptan (RELPAX) 20 MG tablet Take 20 mg by mouth as needed for migraine or headache. May repeat in 2 hours if headache persists or recurs.   Yes Historical Provider, MD  ferrous sulfate 325 (65 FE) MG tablet Take 325 mg by mouth daily with breakfast.   Yes Historical Provider, MD  SUMAtriptan (IMITREX) 100 MG tablet Take 1 tablet (100 mg total) by  mouth once. May repeat in 2 hours if headache persists or recurs. 11/09/14  Yes Anson Fret, MD  famotidine (PEPCID) 20 MG tablet Take 1 tablet (20 mg total) by mouth 2 (two) times daily. 06/30/15   Eber Hong, MD  omeprazole (PRILOSEC) 20 MG capsule Take 1 capsule (20 mg total) by mouth daily. 06/30/15   Eber Hong, MD  ondansetron (ZOFRAN ODT) 4 MG disintegrating tablet Take 1 tablet (4 mg total) by mouth every 2 (two) hours as needed for nausea or vomiting. Make take 1-2 tabs Patient not taking: Reported on 06/30/2015 11/09/14   Anson Fret, MD  verapamil (CALAN-SR) 120 MG CR tablet Take 1 tablet (120 mg total) by mouth at bedtime. Patient not taking: Reported on 06/30/2015  11/09/14   Anson Fret, MD   BP 110/79 mmHg  Pulse 78  Temp(Src) 97.7 F (36.5 C) (Oral)  Resp 16  SpO2 97%  LMP 05/31/2015 (Approximate) Physical Exam  Constitutional: She is oriented to person, place, and time. She appears well-developed and well-nourished. No distress.  HENT:  Head: Normocephalic and atraumatic.  Mouth/Throat: Oropharynx is clear and moist. No oropharyngeal exudate.  Eyes: Conjunctivae and EOM are normal. Pupils are equal, round, and reactive to light. Right eye exhibits no discharge. Left eye exhibits no discharge. No scleral icterus.  Neck: Normal range of motion. Neck supple. No JVD present. No thyromegaly present.  Cardiovascular: Normal rate, regular rhythm, normal heart sounds and intact distal pulses.  Exam reveals no gallop and no friction rub.   No murmur heard. Pulmonary/Chest: Effort normal and breath sounds normal. No respiratory distress. She has no wheezes. She has no rales.  Abdominal: Soft. Bowel sounds are normal. She exhibits no distension and no mass. There is tenderness.  epigastric TTP  Musculoskeletal: Normal range of motion. She exhibits no edema or tenderness.  Lymphadenopathy:    She has no cervical adenopathy.  Neurological: She is alert and oriented to person, place, and time. Coordination normal.  Skin: Skin is warm and dry. No rash noted. She is not diaphoretic. No erythema.  Psychiatric: She has a normal mood and affect. Her behavior is normal.  Nursing note and vitals reviewed.   ED Course  Procedures  DIAGNOSTIC STUDIES: Oxygen Saturation is 98% on RA, normal by my interpretation.    COORDINATION OF CARE: 1:52 AM - Discussed probable gastric ulcers and plans to order diagnostic studies. Pt advised of plan for treatment and pt agrees.  Labs Review Labs Reviewed  COMPREHENSIVE METABOLIC PANEL - Abnormal; Notable for the following:    CO2 21 (*)    Calcium 8.7 (*)    ALT 10 (*)    All other components within normal limits   CBC - Abnormal; Notable for the following:    WBC 11.2 (*)    Hemoglobin 10.6 (*)    HCT 33.2 (*)    All other components within normal limits  LIPASE, BLOOD    Imaging Review No results found. I have personally reviewed and evaluated these images and lab results as part of my medical decision-making.   EKG Interpretation   Date/Time:  Thursday June 30 2015 01:21:38 EST Ventricular Rate:  90 PR Interval:  160 QRS Duration: 88 QT Interval:  372 QTC Calculation: 455 R Axis:   75 Text Interpretation:  Sinus rhythm Normal ECG since last tracing no  significant change Confirmed by Jeniffer Culliver  MD, Carleah Yablonski ([id number]) on 06/30/2015  4:14:04 AM      MDM  Final diagnoses:  Gastroesophageal reflux disease with esophagitis    I personally performed the services described in this documentation, which was scribed in my presence. The recorded information has been reviewed and is accurate.    Pt is well appearing - has normal labs other than WBC of 11,000, has normal VS - given meds with some improvement Doubt pancreatitis Doubt Gall Bladder Likely PUD - given nsaid use Counseled pt on same  Meds given in ED:  Medications  gi cocktail (Maalox,Lidocaine,Donnatal) (30 mLs Oral Given 06/30/15 0203)  famotidine (PEPCID) tablet 20 mg (20 mg Oral Given 06/30/15 0203)    New Prescriptions   FAMOTIDINE (PEPCID) 20 MG TABLET    Take 1 tablet (20 mg total) by mouth 2 (two) times daily.   OMEPRAZOLE (PRILOSEC) 20 MG CAPSULE    Take 1 capsule (20 mg total) by mouth daily.       Eber Hong, MD 06/30/15 [phone]

## 2015-08-20 ENCOUNTER — Encounter (HOSPITAL_COMMUNITY): Payer: Self-pay | Admitting: Oncology

## 2015-08-20 ENCOUNTER — Emergency Department (HOSPITAL_COMMUNITY)
Admission: EM | Admit: 2015-08-20 | Discharge: 2015-08-20 | Disposition: A | Discharge status: Home / Self Care | Payer: No Typology Code available for payment source | Attending: Emergency Medicine | Admitting: Emergency Medicine

## 2015-08-20 DIAGNOSIS — G43909 Migraine, unspecified, not intractable, without status migrainosus: Secondary | ICD-10-CM | POA: Insufficient documentation

## 2015-08-20 DIAGNOSIS — J45909 Unspecified asthma, uncomplicated: Secondary | ICD-10-CM | POA: Insufficient documentation

## 2015-08-20 DIAGNOSIS — Z79899 Other long term (current) drug therapy: Secondary | ICD-10-CM | POA: Insufficient documentation

## 2015-08-20 DIAGNOSIS — D649 Anemia, unspecified: Secondary | ICD-10-CM | POA: Insufficient documentation

## 2015-08-20 DIAGNOSIS — Z8639 Personal history of other endocrine, nutritional and metabolic disease: Secondary | ICD-10-CM | POA: Insufficient documentation

## 2015-08-20 DIAGNOSIS — R5383 Other fatigue: Secondary | ICD-10-CM

## 2015-08-20 DIAGNOSIS — Z0289 Encounter for other administrative examinations: Secondary | ICD-10-CM | POA: Insufficient documentation

## 2015-08-20 LAB — CBG MONITORING, ED: GLUCOSE-CAPILLARY: 85 mg/dL (ref 65–99)

## 2015-08-20 NOTE — ED Provider Notes (Signed)
CSN: [id number]     Arrival date & time 08/20/15  0045 History   First MD Initiated Contact with Patient 08/20/15 0147     Chief Complaint  Patient presents with  . Needs doctors note      (Consider location/radiation/quality/duration/timing/severity/associated sxs/prior Treatment) HPI This is a [age] showed female who presents emergency Department with complaint of exhaustion. Patient states she is working 2 jobs and not sleeping well. She states she did not go into work today because she just felt "too tired." Patient needs a work note. She denies any other complaints at this time. Past Medical History  Diagnosis Date  . Asthma   . Anemia   . Hypoglycemia   . Migraine    Past Surgical History  Procedure Laterality Date  . 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 Tubes in ear      Came out 6th grade  . Eye surgery Left    Family History  Problem Relation Age of Onset  . Cancer Mother   . Hypertension Father   . Diabetes Father   . Asthma Mother   . Migraines Mother   . Breast cancer Maternal Grandmother   . Hypertension Paternal Grandmother   . Hypertension Maternal Grandmother   . Glaucoma     Social History  Substance Use Topics  . Smoking status: Never Smoker   . Smokeless tobacco: Never Used  . Alcohol Use: No     Comment: occasional    OB History    Gravida Para Term Preterm AB TAB SAB Ectopic Multiple Living   0              Review of Systems  Ten systems reviewed and are negative for acute change, except as noted in the HPI.    Allergies  Fexofenadine hcl; Other; and Zyrtec  Home Medications   Prior to Admission medications   Medication Sig Start Date End Date Taking? Authorizing Provider  calcium carbonate (TUMS - DOSED IN MG ELEMENTAL CALCIUM) 500 MG chewable tablet Chew 1 tablet by mouth daily.    Historical Provider, MD  eletriptan (RELPAX) 20 MG tablet Take 20 mg by mouth as needed for migraine or headache. May repeat in 2  hours if headache persists or recurs.    Historical Provider, MD  famotidine (PEPCID) 20 MG tablet Take 1 tablet (20 mg total) by mouth 2 (two) times daily. 06/30/15   Eber HongBrian Miller, MD  ferrous sulfate 325 (65 FE) MG tablet Take 325 mg by mouth daily with breakfast.    Historical Provider, MD  omeprazole (PRILOSEC) 20 MG capsule Take 1 capsule (20 mg total) by mouth daily. 06/30/15   Eber HongBrian Miller, MD  ondansetron (ZOFRAN ODT) 4 MG disintegrating tablet Take 1 tablet (4 mg total) by mouth every 2 (two) hours as needed for nausea or vomiting. Make take 1-2 tabs Patient not taking: Reported on 06/30/2015 11/09/14   Anson FretAntonia B Ahern, MD  SUMAtriptan (IMITREX) 100 MG tablet Take 1 tablet (100 mg total) by mouth once. May repeat in 2 hours if headache persists or recurs. 11/09/14   Anson FretAntonia B Ahern, MD  verapamil (CALAN-SR) 120 MG CR tablet Take 1 tablet (120 mg total) by mouth at bedtime. Patient not taking: Reported on 06/30/2015 11/09/14   Anson FretAntonia B Ahern, MD   BP 127/74 mmHg  Pulse 78  Temp(Src) 97.8 F (36.6 C) (Oral)  Resp 20  SpO2 99%  LMP 07/31/2015 (Approximate) Physical  Exam Physical Exam  Nursing note and vitals reviewed. Constitutional: She is oriented to person, place, and time. She appears well-developed and well-nourished. No distress.  HENT:  Head: Normocephalic and atraumatic.  Eyes: Conjunctivae normal and EOM are normal. Pupils are equal, round, and reactive to light. No scleral icterus.  Neck: Normal range of motion.  Cardiovascular: Normal rate, regular rhythm and normal heart sounds.  Exam reveals no gallop and no friction rub.   No murmur heard. Pulmonary/Chest: Effort normal and breath sounds normal. No respiratory distress.  Abdominal: Soft. Bowel sounds are normal. She exhibits no distension and no mass. There is no tenderness. There is no guarding.  Neurological: She is alert and oriented to person, place, and time.  Skin: Skin is warm and dry. She is not diaphoretic.   ED  Course  Procedures (including critical care time) Labs Review Labs Reviewed  CBG MONITORING, ED    Imaging Review No results found. I have personally reviewed and evaluated these images and lab results as part of my medical decision-making.   EKG Interpretation None      MDM   Final diagnoses:  Exhaustion    1:59 AM BP 127/74 mmHg  Pulse 78  Temp(Src) 97.8 F (36.6 C) (Oral)  Resp 20  SpO2 99%  LMP 07/31/2015 (Approximate) Patient with exhaustion from overworking. She denies any medical problems at this time. Will discharge with a work note for the next 2 days. Advised resting. Appears safe for discharge at this time    Arthor Captain, PA-C 08/20/15 0200  Dione Booze, MD 08/20/15 [phone]

## 2015-08-20 NOTE — ED Notes (Signed)
Pt has not had a day off since March 10th.  Pt c/o feeling tiered, weak and "not right".  Pt states she works 2 jobs and can only get a day off if she has a [employer] note.

## 2015-08-20 NOTE — Discharge Instructions (Signed)
Fatigue  Fatigue is feeling tired all of the time, a lack of energy, or a lack of motivation. Occasional or mild fatigue is often a normal response to activity or life in general. However, long-lasting (chronic) or extreme fatigue may indicate an underlying medical condition.  HOME CARE INSTRUCTIONS   Watch your fatigue for any changes. The following actions may help to lessen any discomfort you are feeling:  · Talk to your health care provider about how much sleep you need each night. Try to get the required amount every night.  · Take medicines only as directed by your health care provider.  · Eat a healthy and nutritious diet. Ask your health care provider if you need help changing your diet.  · Drink enough fluid to keep your urine clear or pale yellow.  · Practice ways of relaxing, such as yoga, meditation, massage therapy, or acupuncture.  · Exercise regularly.    · Change situations that cause you stress. Try to keep your work and personal routine reasonable.  · Do not abuse illegal drugs.  · Limit alcohol intake to no more than 1 drink per day for nonpregnant women and 2 drinks per day for men. One drink equals 12 ounces of beer, 5 ounces of wine, or 1½ ounces of hard liquor.  · Take a multivitamin, if directed by your health care provider.  SEEK MEDICAL CARE IF:   · Your fatigue does not get better.  · You have a fever.    · You have unintentional weight loss or gain.  · You have headaches.    · You have difficulty:      Falling asleep.    Sleeping throughout the night.  · You feel angry, guilty, anxious, or sad.     · You are unable to have a bowel movement (constipation).    · You skin is dry.     · Your legs or another part of your body is swollen.    SEEK IMMEDIATE MEDICAL CARE IF:   · You feel confused.    · Your vision is blurry.  · You feel faint or pass out.    · You have a severe headache.    · You have severe abdominal, pelvic, or back pain.    · You have chest pain, shortness of breath, or an  irregular or fast heartbeat.    · You are unable to urinate or you urinate less than normal.    · You develop abnormal bleeding, such as bleeding from the rectum, vagina, nose, lungs, or nipples.  · You vomit blood.     · You have thoughts about harming yourself or committing suicide.    · You are worried that you might harm someone else.       This information is not intended to replace advice given to you by your health care provider. Make sure you discuss any questions you have with your health care provider.     Document Released: 02/11/2007 Document Revised: 05/07/2014 Document Reviewed: 08/18/2013  Elsevier Interactive Patient Education ©2016 [employer]

## 2016-11-10 IMAGING — CT CT HEAD W/O CM
2 of 4 series · 16 of 30 positions shown, 19 images · non-contrast
Comparison: Prior study from 06/02/2011.

CLINICAL DATA: Initial valuation for acute right facial numbness
with right-sided weakness.

EXAM:
CT HEAD WITHOUT CONTRAST
TECHNIQUE: Contiguous axial images were obtained from the base of the skull
through the vertex without intravenous contrast.

[Series 2: head w/o · axial · non-contrast · 0.45mm/px · z∈[-181,-56]mm · 10 of 31 slices shown, 13 images]
[im 3/31  brain]
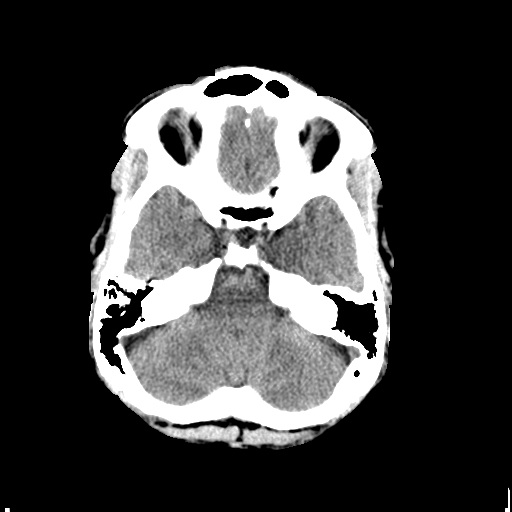
[im 3/31  bone]
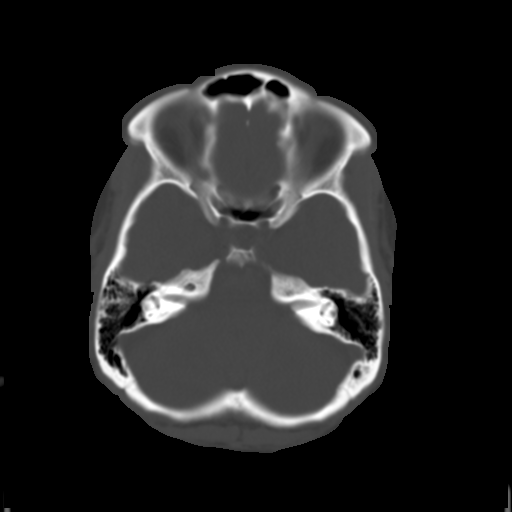
[im 6/31  brain]
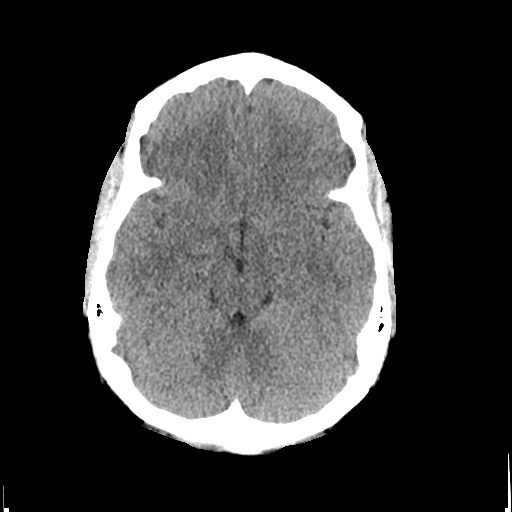
[im 9/31  brain]
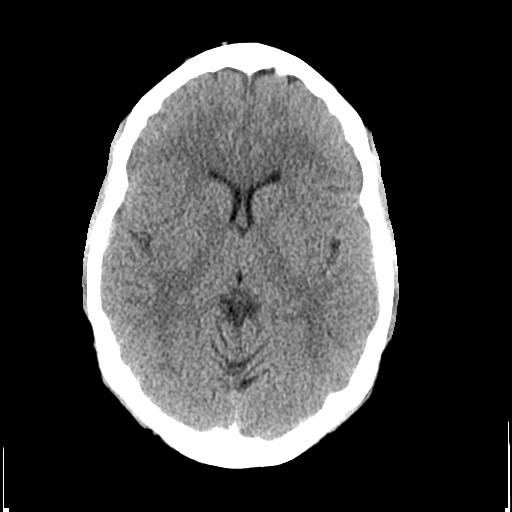
[im 11/31  brain]
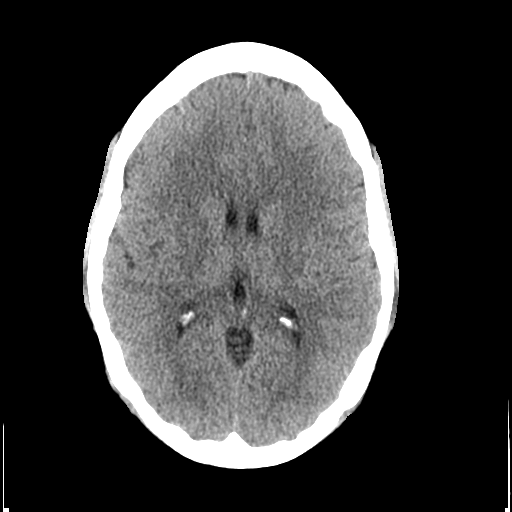
[im 14/31  brain]
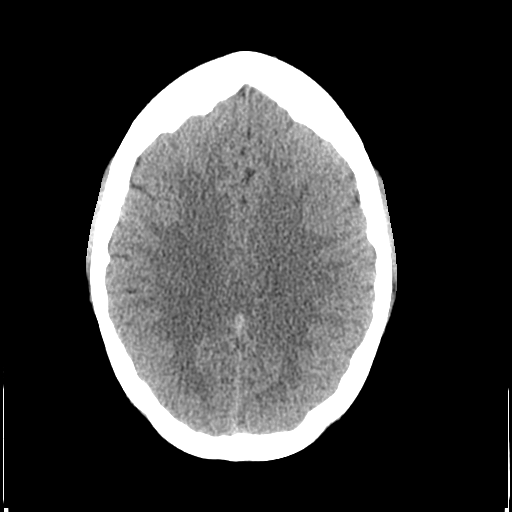
[im 14/31  bone]
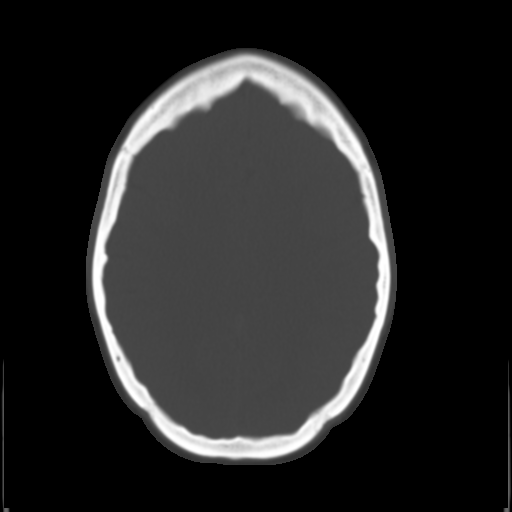
[im 17/31  brain]
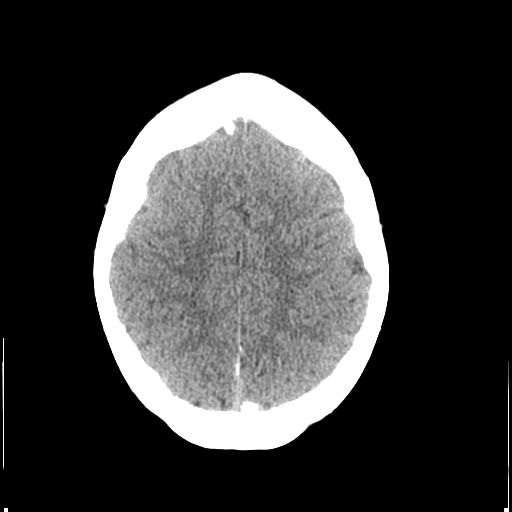
[im 20/31  brain]
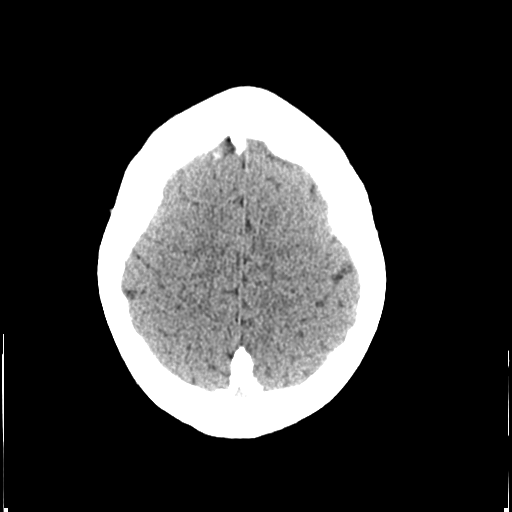
[im 22/31  brain]
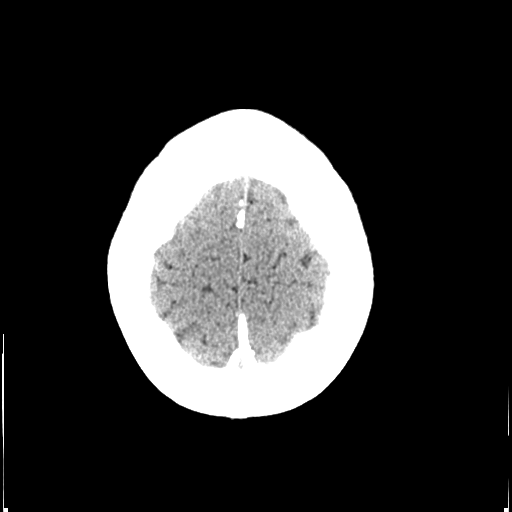
[im 25/31  brain]
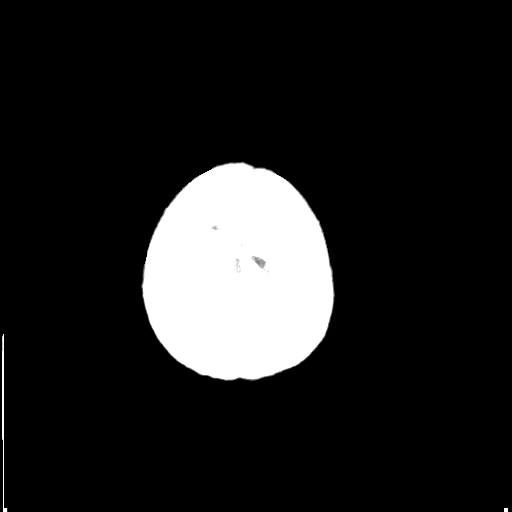
[im 25/31  bone]
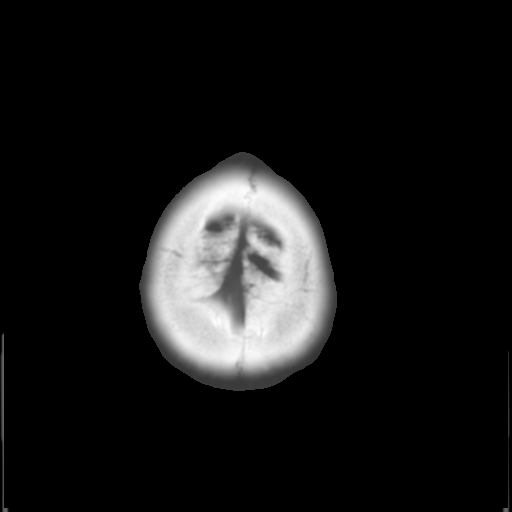
[im 28/31  brain]
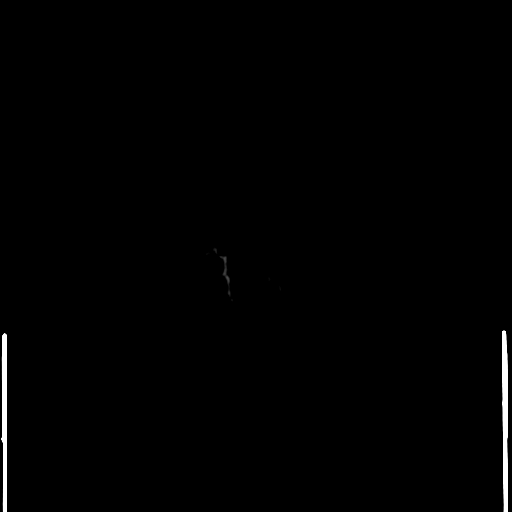

[Series 3: bone windows · axial · 0.45mm/px · z∈[-181,-96]mm · 6 of 31 slices shown]
[im 3/31  bone]
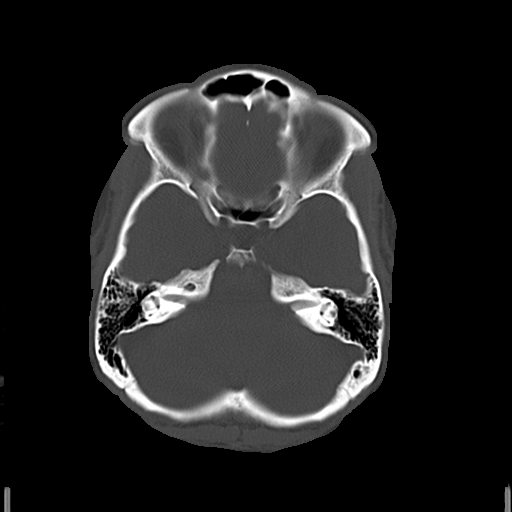
[im 6/31  bone]
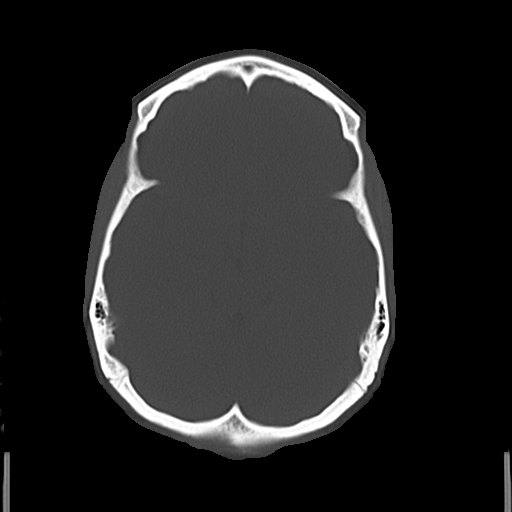
[im 11/31  bone]
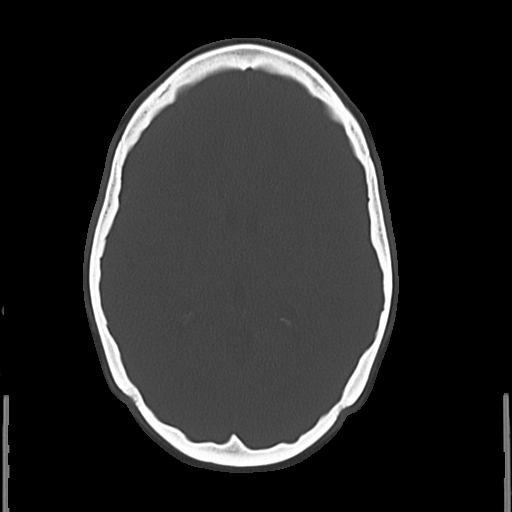
[im 14/31  bone]
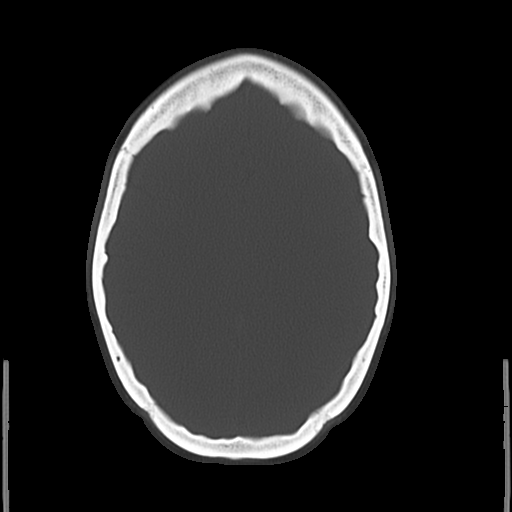
[im 17/31  bone]
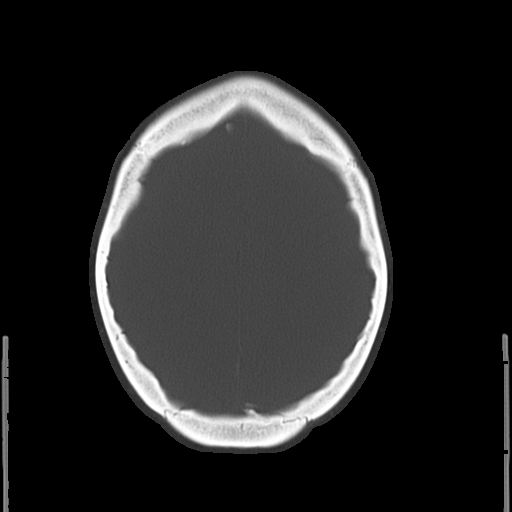
[im 20/31  bone]
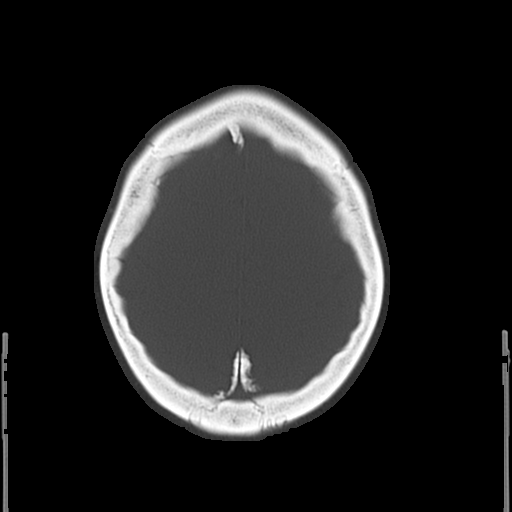

[16 of 30 positions shown; findings below may reference images not displayed]

FINDINGS: There is no acute intracranial hemorrhage or infarct. No mass lesion
or midline shift. Gray-white matter differentiation is well
maintained. Ventricles are normal in size without evidence of
hydrocephalus. CSF containing spaces are within normal limits. No
extra-axial fluid collection.

The calvarium is intact.

Orbital soft tissues are within normal limits.

The paranasal sinuses and mastoid air cells are well pneumatized and
free of fluid.

Scalp soft tissues are unremarkable.
IMPRESSION: Normal head CT with no acute intracranial process identified.

## 2017-03-18 ENCOUNTER — Encounter (HOSPITAL_COMMUNITY): Payer: Self-pay | Admitting: Emergency Medicine

## 2017-03-18 ENCOUNTER — Emergency Department (HOSPITAL_COMMUNITY)
Admission: EM | Admit: 2017-03-18 | Discharge: 2017-03-18 | Disposition: A | Discharge status: Home / Self Care | Payer: No Typology Code available for payment source | Attending: Emergency Medicine | Admitting: Emergency Medicine

## 2017-03-18 DIAGNOSIS — Z79899 Other long term (current) drug therapy: Secondary | ICD-10-CM | POA: Insufficient documentation

## 2017-03-18 DIAGNOSIS — J45909 Unspecified asthma, uncomplicated: Secondary | ICD-10-CM | POA: Insufficient documentation

## 2017-03-18 DIAGNOSIS — G43109 Migraine with aura, not intractable, without status migrainosus: Secondary | ICD-10-CM | POA: Insufficient documentation

## 2017-03-18 DIAGNOSIS — Z7982 Long term (current) use of aspirin: Secondary | ICD-10-CM | POA: Insufficient documentation

## 2017-03-18 LAB — I-STAT CHEM 8, ED
BUN: 13 mg/dL (ref 6–20)
CALCIUM ION: 1.17 mmol/L (ref 1.15–1.40)
CHLORIDE: 103 mmol/L (ref 101–111)
CREATININE: 0.7 mg/dL (ref 0.44–1.00)
GLUCOSE: 82 mg/dL (ref 65–99)
HCT: 38 % (ref 36.0–46.0)
Hemoglobin: 12.9 g/dL (ref 12.0–15.0)
POTASSIUM: 3.7 mmol/L (ref 3.5–5.1)
Sodium: 138 mmol/L (ref 135–145)
TCO2: 27 mmol/L (ref 22–32)

## 2017-03-18 LAB — CBG MONITORING, ED: Glucose-Capillary: 73 mg/dL (ref 65–99)

## 2017-03-18 MED ORDER — SODIUM CHLORIDE 0.9 % IV BOLUS (SEPSIS)
1000.0000 mL | Freq: Once | INTRAVENOUS | Status: AC
  Administered 2017-03-18: 1000 mL via INTRAVENOUS

## 2017-03-18 MED ORDER — KETOROLAC TROMETHAMINE 15 MG/ML IJ SOLN
15.0000 mg | Freq: Once | INTRAMUSCULAR | Status: AC
  Administered 2017-03-18: 15 mg via INTRAVENOUS
  Filled 2017-03-18: qty 1

## 2017-03-18 MED ORDER — PROCHLORPERAZINE EDISYLATE 5 MG/ML IJ SOLN
10.0000 mg | Freq: Once | INTRAMUSCULAR | Status: AC
  Administered 2017-03-18: 10 mg via INTRAVENOUS
  Filled 2017-03-18: qty 2

## 2017-03-18 NOTE — ED Notes (Signed)
Pt given graham crackers to offset hypoglycemia.

## 2017-03-18 NOTE — ED Triage Notes (Signed)
Pt comes in with complaints right hand numbness with migraine that started this morning. Pt has hx of neuropathy. Ambulatory. Denies diabetes.  Pulses in tact and equal bilaterally. Strength in hands strong and equal.  Does report hx of anemia.

## 2017-03-18 NOTE — ED Provider Notes (Signed)
[facility]-EMERGENCY DEPT Provider Note   CSN: [id number] Arrival date & time: 03/18/17  1351     History   Chief Complaint Chief Complaint  Patient presents with  . Migraine  . Tingling    HPI Candace Vasquez is a 26 y.o. female.  HPI Pt woke up with tingling in the right arm and then it moved to the face.  She started developing a headache.  She has history of migraines.  She tried some excedrin without relief.  No fevers.  No recent injuries.  No difficulty with her speech.  No difficulty with her gait  Pt has history of similar sx.  Prior MRI of the brain in 2016 that was normal. Past Medical History:  Diagnosis Date  . Anemia   . Asthma   . Hypoglycemia   . Migraine     Patient Active Problem List   Diagnosis Date Noted  . Chronic migraine without aura without status migrainosus, not intractable 11/09/2014  . Blurry vision 11/09/2014  . Perceived hearing changes 11/09/2014  . Right arm numbness 11/09/2014  . Worsening headaches 11/09/2014    Past Surgical History:  Procedure Laterality Date  . Collapsed lung  1990   When born, had to repair lung  . EYE SURGERY Left   . tubes in ear     Came out 6th grade    OB History    Gravida Para Term Preterm AB Living   0             SAB TAB Ectopic Multiple Live Births                   Home Medications    Prior to Admission medications   Medication Sig Start Date End Date Taking? Authorizing Provider  aspirin-acetaminophen-caffeine (EXCEDRIN MIGRAINE) [phone] MG tablet Take 1 tablet every 6 (six) hours as needed by mouth for headache.   Yes [provider]  calcium carbonate (TUMS - DOSED IN MG ELEMENTAL CALCIUM) 500 MG chewable tablet Chew 1 tablet by mouth daily.    [provider]  eletriptan (RELPAX) 20 MG tablet Take 20 mg by mouth as needed for migraine or headache. May repeat in 2 hours if headache persists or recurs.    [provider]  famotidine  (PEPCID) 20 MG tablet Take 1 tablet (20 mg total) by mouth 2 (two) times daily. Patient not taking: Reported on 03/18/2017 06/30/15   Eber HongMiller, Brian, MD  ferrous sulfate 325 (65 FE) MG tablet Take 325 mg by mouth daily with breakfast.    [provider]  omeprazole (PRILOSEC) 20 MG capsule Take 1 capsule (20 mg total) by mouth daily. Patient not taking: Reported on 03/18/2017 06/30/15   Eber HongMiller, Brian, MD  ondansetron (ZOFRAN ODT) 4 MG disintegrating tablet Take 1 tablet (4 mg total) by mouth every 2 (two) hours as needed for nausea or vomiting. Make take 1-2 tabs Patient not taking: Reported on 06/30/2015 11/09/14   Anson FretAhern, Antonia B, MD  SUMAtriptan (IMITREX) 100 MG tablet Take 1 tablet (100 mg total) by mouth once. May repeat in 2 hours if headache persists or recurs. Patient not taking: Reported on 03/18/2017 11/09/14   Anson FretAhern, Antonia B, MD  verapamil (CALAN-SR) 120 MG CR tablet Take 1 tablet (120 mg total) by mouth at bedtime. Patient not taking: Reported on 06/30/2015 11/09/14   Anson FretAhern, Antonia B, MD    Family History Family History  Problem Relation Age of Onset  . Cancer  Mother   . Asthma Mother   . Migraines Mother   . Hypertension Father   . Diabetes Father   . Breast cancer Maternal Grandmother   . Hypertension Maternal Grandmother   . Hypertension Paternal Grandmother   . Glaucoma Unknown     Social History Social History   Tobacco Use  . Smoking status: Never Smoker  . Smokeless tobacco: Never Used  Substance Use Topics  . Alcohol use: No    Comment: occasional   . Drug use: No     Allergies   Fexofenadine hcl; Other; and Zyrtec [cetirizine hcl]   Review of Systems Review of Systems  All other systems reviewed and are negative.    Physical Exam Updated Vital Signs BP 111/72 (BP Location: Right Arm)   Pulse 100   Temp 98 F (36.7 C) (Oral)   Resp 20   Ht 1.702 m (5\' 7" )   Wt 80.3 kg (177 lb)   LMP 02/28/2017   SpO2 100%   BMI 27.72 kg/m   Physical  Exam  Constitutional: She is oriented to person, place, and time. She appears well-developed and well-nourished. No distress.  HENT:  Head: Normocephalic and atraumatic.  Right Ear: External ear normal.  Left Ear: External ear normal.  Mouth/Throat: Oropharynx is clear and moist.  Eyes: Conjunctivae are normal. Right eye exhibits no discharge. Left eye exhibits no discharge. No scleral icterus.  Neck: Neck supple. No tracheal deviation present.  Cardiovascular: Normal rate, regular rhythm and intact distal pulses.  Pulmonary/Chest: Effort normal and breath sounds normal. No stridor. No respiratory distress. She has no wheezes. She has no rales.  Abdominal: Soft. Bowel sounds are normal. She exhibits no distension. There is no tenderness. There is no rebound and no guarding.  Musculoskeletal: She exhibits no edema or tenderness.  Neurological: She is alert and oriented to person, place, and time. She has normal strength. No cranial nerve deficit (No facial droop, extraocular movements intact, tongue midline ) or sensory deficit. She exhibits normal muscle tone. She displays no seizure activity. Coordination normal.  No pronator drift bilateral upper extrem, able to hold both legs off bed for 5 seconds, sensation intact in all extremities, no visual field cuts, no left or right sided neglect, normal finger-nose exam bilaterally, no nystagmus noted   Skin: Skin is warm and dry. No rash noted.  Psychiatric: She has a normal mood and affect.  Nursing note and vitals reviewed.    ED Treatments / Results  Labs (all labs ordered are listed, but only abnormal results are displayed) Labs Reviewed  CBG MONITORING, ED  I-STAT CHEM 8, ED   Radiology No results found.  Procedures Procedures (including critical care time)  Medications Ordered in ED Medications  sodium chloride 0.9 % bolus 1,000 mL (1,000 mLs Intravenous New Bag/Given 03/18/17 2109)  prochlorperazine (COMPAZINE) injection 10 mg  (10 mg Intravenous Given 03/18/17 2110)  ketorolac (TORADOL) 15 MG/ML injection 15 mg (15 mg Intravenous Given 03/18/17 2110)     Initial Impression / Assessment and Plan / ED Course  I have reviewed the triage vital signs and the nursing notes.  Pertinent labs & imaging results that were available during my care of the patient were reviewed by me and considered in my medical decision making (see chart for details).   Patient's symptoms are suggestive of a migraine headache.  She has a normal neurologic exam.  Previous MRI of the brain was normal when the patient had a similar episode in  the past.  Patient's symptoms improved with migraine cocktail.  I doubt infection tumor mass or other emergent condition.  She appears stable for discharge  Final Clinical Impressions(s) / ED Diagnoses   Final diagnoses:  Migraine with aura and without status migrainosus, not intractable    ED Discharge Orders    None       Linwood Dibbles, MD 03/18/17 2132

## 2017-03-18 NOTE — Discharge Instructions (Signed)
Follow-up with the primary care doctor, continue your current medications

## 2018-03-06 ENCOUNTER — Ambulatory Visit (INDEPENDENT_AMBULATORY_CARE_PROVIDER_SITE_OTHER): Payer: No Typology Code available for payment source | Admitting: Obstetrics and Gynecology

## 2018-03-06 ENCOUNTER — Other Ambulatory Visit: Payer: Self-pay

## 2018-03-06 ENCOUNTER — Encounter: Payer: Self-pay | Admitting: Obstetrics and Gynecology

## 2018-03-06 ENCOUNTER — Other Ambulatory Visit (HOSPITAL_COMMUNITY)
Admission: RE | Admit: 2018-03-06 | Discharge: 2018-03-06 | Disposition: A | Discharge status: Home / Self Care | Payer: No Typology Code available for payment source | Source: Ambulatory Visit | Attending: Obstetrics and Gynecology | Admitting: Obstetrics and Gynecology

## 2018-03-06 VITALS — BP 130/74 | HR 82 | Ht 65.75 in | Wt 186.6 lb

## 2018-03-06 DIAGNOSIS — Z0000 Encounter for general adult medical examination without abnormal findings: Secondary | ICD-10-CM | POA: Diagnosis not present

## 2018-03-06 DIAGNOSIS — Z01419 Encounter for gynecological examination (general) (routine) without abnormal findings: Secondary | ICD-10-CM

## 2018-03-06 DIAGNOSIS — Z124 Encounter for screening for malignant neoplasm of cervix: Secondary | ICD-10-CM

## 2018-03-06 DIAGNOSIS — Z803 Family history of malignant neoplasm of breast: Secondary | ICD-10-CM

## 2018-03-06 DIAGNOSIS — Z113 Encounter for screening for infections with a predominantly sexual mode of transmission: Secondary | ICD-10-CM | POA: Insufficient documentation

## 2018-03-06 DIAGNOSIS — Z862 Personal history of diseases of the blood and blood-forming organs and certain disorders involving the immune mechanism: Secondary | ICD-10-CM

## 2018-03-06 NOTE — Patient Instructions (Signed)
EXERCISE AND DIET:  We recommended that you start or continue a regular exercise program for good health. Regular exercise means any activity that makes your heart beat faster and makes you sweat.  We recommend exercising at least 30 minutes per day at least 3 days a week, preferably 4 or 5.  We also recommend a diet low in fat and sugar.  Inactivity, poor dietary choices and obesity can cause diabetes, heart attack, stroke, and kidney damage, among others.    ALCOHOL AND SMOKING:  Women should limit their alcohol intake to no more than 7 drinks/beers/glasses of wine (combined, not each!) per week. Moderation of alcohol intake to this level decreases your risk of breast cancer and liver damage. And of course, no recreational drugs are part of a healthy lifestyle.  And absolutely no smoking or even second hand smoke. Most people know smoking can cause heart and lung diseases, but did you know it also contributes to weakening of your bones? Aging of your skin?  Yellowing of your teeth and nails?  CALCIUM AND VITAMIN D:  Adequate intake of calcium and Vitamin D are recommended.  The recommendations for exact amounts of these supplements seem to change often, but generally speaking 1,000 mg of calcium (between diet and supplements) and 800 units of Vitamin D per day seems prudent. Certain women may benefit from higher intake of Vitamin D.  If you are among these women, your doctor will have told you during your visit.    PAP SMEARS:  Pap smears, to check for cervical cancer or precancers,  have traditionally been done yearly, although recent scientific advances have shown that most women can have pap smears less often.  However, every woman still should have a physical exam from her gynecologist every year. It will include a breast check, inspection of the vulva and vagina to check for abnormal growths or skin changes, a visual exam of the cervix, and then an exam to evaluate the size and shape of the uterus and  ovaries.  And after 29 years of age, a rectal exam is indicated to check for rectal cancers. We will also provide age appropriate advice regarding health maintenance, like when you should have certain vaccines, screening for sexually transmitted diseases, bone density testing, colonoscopy, mammograms, etc.   MAMMOGRAMS:  All women over 40 years old should have a yearly mammogram. Many facilities now offer a "3D" mammogram, which may cost around $50 extra out of pocket. If possible,  we recommend you accept the option to have the 3D mammogram performed.  It both reduces the number of women who will be called back for extra views which then turn out to be normal, and it is better than the routine mammogram at detecting truly abnormal areas.    COLONOSCOPY:  Colonoscopy to screen for colon cancer is recommended for all women at age 50.  We know, you hate the idea of the prep.  We agree, BUT, having colon cancer and not knowing it is worse!!  Colon cancer so often starts as a polyp that can be seen and removed at colonscopy, which can quite literally save your life!  And if your first colonoscopy is normal and you have no family history of colon cancer, most women don't have to have it again for 10 years.  Once every ten years, you can do something that may end up saving your life, right?  We will be happy to help you get it scheduled when you are ready.    Be sure to check your insurance coverage so you understand how much it will cost.  It may be covered as a preventative service at no cost, but you should check your particular policy.     Breast Self-Awareness Breast self-awareness means being familiar with how your breasts look and feel. It involves checking your breasts regularly and reporting any changes to your health care provider. Practicing breast self-awareness is important. A change in your breasts can be a sign of a serious medical problem. Being familiar with how your breasts look and feel allows  you to find any problems early, when treatment is more likely to be successful. All women should practice breast self-awareness, including women who have had breast implants. How to do a breast self-exam One way to learn what is normal for your breasts and whether your breasts are changing is to do a breast self-exam. To do a breast self-exam: Look for Changes  1. Remove all the clothing above your waist. 2. Stand in front of a mirror in a room with good lighting. 3. Put your hands on your hips. 4. Push your hands firmly downward. 5. Compare your breasts in the mirror. Look for differences between them (asymmetry), such as: ? Differences in shape. ? Differences in size. ? Puckers, dips, and bumps in one breast and not the other. 6. Look at each breast for changes in your skin, such as: ? Redness. ? Scaly areas. 7. Look for changes in your nipples, such as: ? Discharge. ? Bleeding. ? Dimpling. ? Redness. ? A change in position. Feel for Changes  Carefully feel your breasts for lumps and changes. It is best to do this while lying on your back on the floor and again while sitting or standing in the shower or tub with soapy water on your skin. Feel each breast in the following way:  Place the arm on the side of the breast you are examining above your head.  Feel your breast with the other hand.  Start in the nipple area and make  inch (2 cm) overlapping circles to feel your breast. Use the pads of your three middle fingers to do this. Apply light pressure, then medium pressure, then firm pressure. The light pressure will allow you to feel the tissue closest to the skin. The medium pressure will allow you to feel the tissue that is a little deeper. The firm pressure will allow you to feel the tissue close to the ribs.  Continue the overlapping circles, moving downward over the breast until you feel your ribs below your breast.  Move one finger-width toward the center of the body.  Continue to use the  inch (2 cm) overlapping circles to feel your breast as you move slowly up toward your collarbone.  Continue the up and down exam using all three pressures until you reach your armpit.  Write Down What You Find  Write down what is normal for each breast and any changes that you find. Keep a written record with breast changes or normal findings for each breast. By writing this information down, you do not need to depend only on memory for size, tenderness, or location. Write down where you are in your menstrual cycle, if you are still menstruating. If you are having trouble noticing differences in your breasts, do not get discouraged. With time you will become more familiar with the variations in your breasts and more comfortable with the exam. How often should I examine my breasts? Examine your   breasts every month. If you are breastfeeding, the best time to examine your breasts is after a feeding or after using a breast pump. If you menstruate, the best time to examine your breasts is 5-7 days after your period is over. During your period, your breasts are lumpier, and it may be more difficult to notice changes. When should I see my health care provider? See your health care provider if you notice:  A change in shape or size of your breasts or nipples.  A change in the skin of your breast or nipples, such as a reddened or scaly area.  Unusual discharge from your nipples.  A lump or thick area that was not there before.  Pain in your breasts.  Anything that concerns you.  This information is not intended to replace advice given to you by your health care provider. Make sure you discuss any questions you have with your health care provider. Document Released: 04/16/2005 Document Revised: 09/22/2015 Document Reviewed: 03/06/2015 Elsevier Interactive Patient Education  2018 [employer] Inc[employer]   

## 2018-03-06 NOTE — Progress Notes (Signed)
27 y.o. G0P0000 Single Black or African American Not Hispanic or Latino female here for annual exam. Sexually active, female partner x 1 year. No dyspareunia.    Period Cycle (Days): 28 Period Duration (Days): 4-5 days Period Pattern: Regular Menstrual Flow: Heavy, Moderate Menstrual Control: Tampon, Thin pad Menstrual Control Change Freq (Hours): changes tampon/pad every 2 hours Dysmenorrhea: (!) Mild Dysmenorrhea Symptoms: Cramping  One day is heavy, she can saturate a super+ tampon in 5-6 hours. Changes every 2 hours to be clean.   Patient's last menstrual period was 02/20/2018 (exact date).          Sexually active: Yes.    The current method of family planning is none. Female partner.   Exercising: Yes.    cardio,strength Smoker:  no  Health Maintenance: Pap:  None History of abnormal Pap:  no TDaP:  Up to date Gardasil: completed all three   reports that she has never smoked. She has never used smokeless tobacco. She reports that she does not drink alcohol or use drugs. Just occasional ETOH. She manages [employer].   Past Medical History:  Diagnosis Date  . Anemia   . Asthma   . Migraine     Past Surgical History:  Procedure Laterality Date  . Collapsed lung  1990   When born, had to repair lung  . EYE SURGERY Left   . tubes in ear     Came out 6th grade  she was born at 32 weeks.   No current outpatient medications on file.   No current facility-administered medications for this visit.     Family History  Problem Relation Age of Onset  . Cancer Mother   . Asthma Mother   . Migraines Mother   . Hypertension Father   . Diabetes Father   . Breast cancer Maternal Grandmother   . Hypertension Maternal Grandmother   . Hypertension Paternal Grandmother   . Glaucoma Unknown   . Breast cancer Unknown   . Breast cancer Maternal Aunt   . Breast cancer Paternal Uncle   mom has cervical cancer.  2 PA's with breast cancer, one in her 40's one in her 63's   MGM breast cancer late 60's, MA breast cancer 56, M niece breast (brothers daughter, the girls mom also with strong fh of cancer) cancer at 24  Review of Systems  Constitutional: Negative.   HENT: Negative.   Eyes: Negative.   Respiratory: Negative.   Cardiovascular: Negative.   Endocrine: Negative.   Genitourinary: Negative.   Musculoskeletal: Positive for back pain.  Skin: Negative.   Allergic/Immunologic: Negative.   Neurological: Negative.   Hematological: Negative.   Psychiatric/Behavioral: Negative.     Exam:   BP 130/74 (BP Location: Right Arm, Patient Position: Sitting, Cuff Size: Normal)   Pulse 82   Ht 5' 5.75" (1.67 m)   Wt 186 lb 9.6 oz (84.6 kg)   LMP 02/20/2018 (Exact Date)   BMI 30.35 kg/m   Weight change: @WEIGHTCHANGE @ Height:   Height: 5' 5.75" (167 cm)  Ht Readings from Last 3 Encounters:  03/06/18 5' 5.75" (1.67 m)  03/18/17 5\' 7"  (1.702 m)  11/09/14 5\' 8"  (1.727 m)    General appearance: alert, cooperative and appears stated age Head: Normocephalic, without obvious abnormality, atraumatic Neck: no adenopathy, supple, symmetrical, trachea midline and thyroid normal to inspection and palpation Lungs: clear to auscultation bilaterally Cardiovascular: regular rate and rhythm Breasts: normal appearance, no masses or tenderness Abdomen: soft, non-tender; non distended,  no masses,  no organomegaly Extremities: extremities normal, atraumatic, no cyanosis or edema Skin: Skin color, texture, turgor normal. No rashes or lesions Lymph nodes: Cervical, supraclavicular, and axillary nodes normal. No abnormal inguinal nodes palpated Neurologic: Grossly normal   Pelvic: External genitalia:  no lesions              Urethra:  normal appearing urethra with no masses, tenderness or lesions              Bartholins and Skenes: normal                 Vagina: normal appearing vagina with normal color and discharge, no lesions              Cervix: no lesions (only  anterior lip well seen, used pediatric speculum)               Bimanual Exam:  Uterus:  normal size, contour, position, consistency, mobility, non-tender              Adnexa: no mass, fullness, tenderness               Rectovaginal: Confirms               Anus:  normal sphincter tone, no lesions  Chaperone was present for exam.  A:  Well Woman with normal exam  History of anemia  History of anemia  P:   Referral to Genetic  Pap with GC/CT, reflex HPV  Screening labs, Ferritin  Discussed breast self exam, information given  Discussed calcium and vit D intake  STD testing

## 2018-03-07 LAB — COMPREHENSIVE METABOLIC PANEL
A/G RATIO: 1.3 (ref 1.2–2.2)
ALBUMIN: 4.4 g/dL (ref 3.5–5.5)
ALK PHOS: 70 IU/L (ref 39–117)
ALT: 8 IU/L (ref 0–32)
AST: 17 IU/L (ref 0–40)
BILIRUBIN TOTAL: 0.2 mg/dL (ref 0.0–1.2)
BUN / CREAT RATIO: 14 (ref 9–23)
BUN: 10 mg/dL (ref 6–20)
CHLORIDE: 102 mmol/L (ref 96–106)
CO2: 22 mmol/L (ref 20–29)
Calcium: 9 mg/dL (ref 8.7–10.2)
Creatinine, Ser: 0.74 mg/dL (ref 0.57–1.00)
GFR calc non Af Amer: 110 mL/min/{1.73_m2} (ref >59)
GFR, EST AFRICAN AMERICAN: 127 mL/min/{1.73_m2} (ref >59)
GLOBULIN, TOTAL: 3.3 g/dL (ref 1.5–4.5)
GLUCOSE: 73 mg/dL (ref 65–99)
Potassium: 4.1 mmol/L (ref 3.5–5.2)
SODIUM: 139 mmol/L (ref 134–144)
TOTAL PROTEIN: 7.7 g/dL (ref 6.0–8.5)

## 2018-03-07 LAB — HEP, RPR, HIV PANEL
HEP B S AG: NEGATIVE
HIV SCREEN 4TH GENERATION: NONREACTIVE
RPR: NONREACTIVE

## 2018-03-07 LAB — LIPID PANEL
CHOLESTEROL TOTAL: 187 mg/dL (ref 100–199)
Chol/HDL Ratio: 3 ratio (ref 0.0–4.4)
HDL: 63 mg/dL (ref >39)
LDL Calculated: 105 mg/dL — ABNORMAL HIGH (ref 0–99)
Triglycerides: 96 mg/dL (ref 0–149)
VLDL Cholesterol Cal: 19 mg/dL (ref 5–40)

## 2018-03-07 LAB — HEPATITIS C ANTIBODY: Hep C Virus Ab: <0.1 s/co ratio (ref 0.0–0.9)

## 2018-03-07 LAB — CBC
Hematocrit: 34.3 % (ref 34.0–46.6)
Hemoglobin: 10.9 g/dL — ABNORMAL LOW (ref 11.1–15.9)
MCH: 25.1 pg — AB (ref 26.6–33.0)
MCHC: 31.8 g/dL (ref 31.5–35.7)
MCV: 79 fL (ref 79–97)
PLATELETS: 336 10*3/uL (ref 150–450)
RBC: 4.34 x10E6/uL (ref 3.77–5.28)
RDW: 14.9 % (ref 12.3–15.4)
WBC: 11.3 10*3/uL — ABNORMAL HIGH (ref 3.4–10.8)

## 2018-03-07 LAB — FERRITIN: Ferritin: 5 ng/mL — ABNORMAL LOW (ref 15–150)

## 2018-03-11 LAB — CYTOLOGY - PAP
CHLAMYDIA, DNA PROBE: NEGATIVE
DIAGNOSIS: NEGATIVE
NEISSERIA GONORRHEA: NEGATIVE
Trichomonas: NEGATIVE

## 2018-03-13 ENCOUNTER — Other Ambulatory Visit: Payer: Self-pay

## 2018-03-13 DIAGNOSIS — D508 Other iron deficiency anemias: Secondary | ICD-10-CM

## 2018-04-14 ENCOUNTER — Other Ambulatory Visit: Payer: No Typology Code available for payment source

## 2018-04-17 ENCOUNTER — Other Ambulatory Visit (INDEPENDENT_AMBULATORY_CARE_PROVIDER_SITE_OTHER): Payer: No Typology Code available for payment source

## 2018-04-17 DIAGNOSIS — D508 Other iron deficiency anemias: Secondary | ICD-10-CM

## 2018-04-18 LAB — CBC
HEMOGLOBIN: 10.5 g/dL — AB (ref 11.1–15.9)
Hematocrit: 32.6 % — ABNORMAL LOW (ref 34.0–46.6)
MCH: 25.9 pg — AB (ref 26.6–33.0)
MCHC: 32.2 g/dL (ref 31.5–35.7)
MCV: 80 fL (ref 79–97)
Platelets: 264 10*3/uL (ref 150–450)
RBC: 4.06 x10E6/uL (ref 3.77–5.28)
RDW: 14.8 % (ref 12.3–15.4)
WBC: 10 10*3/uL (ref 3.4–10.8)

## 2018-04-18 LAB — FERRITIN: Ferritin: 5 ng/mL — ABNORMAL LOW (ref 15–150)

## 2018-04-21 ENCOUNTER — Telehealth: Payer: Self-pay

## 2018-04-21 DIAGNOSIS — D508 Other iron deficiency anemias: Secondary | ICD-10-CM

## 2018-04-21 NOTE — Telephone Encounter (Signed)
-----   Message from Romualdo BolkJill Evelyn Jertson, MD sent at 04/18/2018 10:24 AM EST ----- She is still anemic with low iron stores, please inform and see if she has been taking her iron. Please set her up to see hematology for a consult for an iron transfusion

## 2018-04-21 NOTE — Telephone Encounter (Signed)
Attempted to reach patient at number provided, there was no answer and voicemail box is full. 

## 2018-04-24 NOTE — Telephone Encounter (Signed)
Attempted to reach patient at number provided, there was no answer and voicemail box is full. 

## 2018-04-28 NOTE — Telephone Encounter (Signed)
Spoke with patient. Advised of message as seen below from Dr.Jertson. Patient verbalizes understanding. [facility] and iron transfusion. Patient is aware she will be contacted directly to scheduled this appointment.  Routing to provider and will close encounter.

## 2018-04-28 NOTE — Telephone Encounter (Signed)
Left message to call Lavette Yankovich at [phone]. 

## 2018-04-28 NOTE — Telephone Encounter (Signed)
Patient is returning a call to Kaitlyn. °

## 2018-04-29 ENCOUNTER — Telehealth: Payer: Self-pay | Admitting: Obstetrics and Gynecology

## 2018-04-29 NOTE — Telephone Encounter (Signed)
Left voicemail regarding referral appointment. The information is listed below. Should the patient need to cancel or reschedule this appointment, Please advise them to call the office they've been referred to in order to reschedule.  HEMATOLOGY/ONCOLOGY DR. Charlesetta ShanksENNEVER [facility] [facility] [address] [place], [place] [id number] PHONE: [phone]  Dr. Myna HidalgoEnnever 05/20/18 @ 10:15 am. Please arrive 15 minutes early and bring your insurance card and photo id and list of medications.

## 2018-05-20 ENCOUNTER — Other Ambulatory Visit: Payer: Self-pay

## 2018-05-20 ENCOUNTER — Ambulatory Visit: Payer: Self-pay

## 2018-05-20 ENCOUNTER — Ambulatory Visit: Payer: Self-pay | Admitting: Hematology & Oncology

## 2018-11-12 ENCOUNTER — Other Ambulatory Visit: Payer: Self-pay

## 2018-11-12 DIAGNOSIS — Z20822 Contact with and (suspected) exposure to covid-19: Secondary | ICD-10-CM

## 2018-11-16 LAB — NOVEL CORONAVIRUS, NAA: SARS-CoV-2, NAA: NOT DETECTED

## 2019-03-18 ENCOUNTER — Ambulatory Visit: Payer: Self-pay | Admitting: Obstetrics and Gynecology

## 2019-03-18 NOTE — Progress Notes (Deleted)
28 y.o. G0P0000 Single Black or African American Not Hispanic or Latino female here for annual exam.      No LMP recorded.          Sexually active: {yes no:314532}  The current method of family planning is {contraception:315051}.    Exercising: {yes no:314532}  {types:19826} Smoker:  {YES [id number]  Health Maintenance: Pap:  03/06/2018 WNL History of abnormal Pap:  no TDaP:  Up to date Gardasil: completed all three   reports that she has never smoked. She has never used smokeless tobacco. She reports that she does not drink alcohol or use drugs.  Past Medical History:  Diagnosis Date  . Anemia   . Asthma   . Migraine     Past Surgical History:  Procedure Laterality Date  . Collapsed lung  1990   When born, had to repair lung  . EYE SURGERY Left   . tubes in ear     Came out 6th grade    No current outpatient medications on file.   No current facility-administered medications for this visit.     Family History  Problem Relation Age of Onset  . Cancer Mother   . Asthma Mother   . Migraines Mother   . Hypertension Father   . Diabetes Father   . Breast cancer Maternal Grandmother   . Hypertension Maternal Grandmother   . Hypertension Paternal Grandmother   . Glaucoma Unknown   . Breast cancer Unknown   . Breast cancer Maternal Aunt   . Breast cancer Paternal Uncle     Review of Systems  Exam:   There were no vitals taken for this visit.  Weight change: @WEIGHTCHANGE @ Height:      Ht Readings from Last 3 Encounters:  03/06/18 5' 5.75" (1.67 m)  03/18/17 5\' 7"  (1.702 m)  11/09/14 5\' 8"  (1.727 m)    General appearance: alert, cooperative and appears stated age Head: Normocephalic, without obvious abnormality, atraumatic Neck: no adenopathy, supple, symmetrical, trachea midline and thyroid {CHL AMB PHY EX THYROID NORM DEFAULT:[phone]::"normal to inspection and palpation"} Lungs: clear to auscultation bilaterally Cardiovascular: regular rate and  rhythm Breasts: {Exam; breast:13139::"normal appearance, no masses or tenderness"} Abdomen: soft, non-tender; non distended,  no masses,  no organomegaly Extremities: extremities normal, atraumatic, no cyanosis or edema Skin: Skin color, texture, turgor normal. No rashes or lesions Lymph nodes: Cervical, supraclavicular, and axillary nodes normal. No abnormal inguinal nodes palpated Neurologic: Grossly normal   Pelvic: External genitalia:  no lesions              Urethra:  normal appearing urethra with no masses, tenderness or lesions              Bartholins and Skenes: normal                 Vagina: normal appearing vagina with normal color and discharge, no lesions              Cervix: {CHL AMB PHY EX CERVIX NORM DEFAULT:[phone]::"no lesions"}               Bimanual Exam:  Uterus:  {CHL AMB PHY EX UTERUS NORM DEFAULT:[phone]::"normal size, contour, position, consistency, mobility, non-tender"}              Adnexa: {CHL AMB PHY EX ADNEXA NO MASS DEFAULT:[phone]::"no mass, fullness, tenderness"}               Rectovaginal: Confirms  Anus:  normal sphincter tone, no lesions  Chaperone was present for exam.  A:  Well Woman with normal exam  P:

## 2021-07-10 ENCOUNTER — Telehealth: Payer: Self-pay
